# Patient Record
Sex: Male | Born: 1950 | Race: White | Hispanic: No | Marital: Married | Smoking: Former smoker
Health system: Southern US, Community
[De-identification: ages and names within clinical notes are randomized; demographics above are authoritative.]

## PROBLEM LIST (undated history)

## (undated) DIAGNOSIS — Z973 Presence of spectacles and contact lenses: Secondary | ICD-10-CM

## (undated) DIAGNOSIS — F419 Anxiety disorder, unspecified: Secondary | ICD-10-CM

## (undated) DIAGNOSIS — R7303 Prediabetes: Secondary | ICD-10-CM

## (undated) DIAGNOSIS — J302 Other seasonal allergic rhinitis: Secondary | ICD-10-CM

## (undated) DIAGNOSIS — M5430 Sciatica, unspecified side: Secondary | ICD-10-CM

## (undated) DIAGNOSIS — G2581 Restless legs syndrome: Secondary | ICD-10-CM

## (undated) DIAGNOSIS — Z87442 Personal history of urinary calculi: Secondary | ICD-10-CM

## (undated) DIAGNOSIS — K219 Gastro-esophageal reflux disease without esophagitis: Secondary | ICD-10-CM

## (undated) DIAGNOSIS — K5790 Diverticulosis of intestine, part unspecified, without perforation or abscess without bleeding: Secondary | ICD-10-CM

## (undated) DIAGNOSIS — N486 Induration penis plastica: Secondary | ICD-10-CM

## (undated) DIAGNOSIS — F329 Major depressive disorder, single episode, unspecified: Secondary | ICD-10-CM

## (undated) DIAGNOSIS — F32A Depression, unspecified: Secondary | ICD-10-CM

## (undated) HISTORY — DX: Major depressive disorder, single episode, unspecified: F32.9

## (undated) HISTORY — PX: WISDOM TOOTH EXTRACTION: SHX21

## (undated) HISTORY — DX: Depression, unspecified: F32.A

## (undated) HISTORY — DX: Anxiety disorder, unspecified: F41.9

## (undated) HISTORY — PX: HERNIA REPAIR: SHX51

## (undated) HISTORY — PX: COLONOSCOPY: SHX174

---
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
## 2009-05-07 ENCOUNTER — Ambulatory Visit (HOSPITAL_COMMUNITY): Admission: RE | Admit: 2009-05-07 | Discharge: 2009-05-07 | Payer: Self-pay | Admitting: General Surgery

## 2011-04-08 LAB — URINALYSIS, ROUTINE W REFLEX MICROSCOPIC
Bilirubin Urine: NEGATIVE
Glucose, UA: NEGATIVE mg/dL
Ketones, ur: NEGATIVE mg/dL
Protein, ur: NEGATIVE mg/dL
pH: 7 (ref 5.0–8.0)

## 2011-04-08 LAB — HEMOGLOBIN AND HEMATOCRIT, BLOOD: HCT: 39.7 % (ref 39.0–52.0)

## 2011-05-13 NOTE — Op Note (Signed)
NAMEMILIANO, COTTEN               ACCOUNT NO.:  [id number]   MEDICAL RECORD NO.:  [id number]          PATIENT TYPE:  AMB   LOCATION:  DAY                          FACILITY:  [facility]   PHYSICIAN:  Almond Lint, MD       DATE OF BIRTH:  02-Feb-1951   DATE OF PROCEDURE:  05/07/2009  DATE OF DISCHARGE:                               OPERATIVE REPORT   PREOPERATIVE DIAGNOSIS:  Right inguinal hernia.   POSTOPERATIVE DIAGNOSIS:  Right inguinal hernia.   PROCEDURE PERFORMED:  Right inguinal herniorrhaphy with mesh.   SURGEON:  Almond Lint, MD   ASSISTANTLoreta Ave, RNFA.   ANESTHESIA:  General and local.   FINDINGS:  Indirect inguinal hernia and cord lipoma.   SPECIMENS:  None.   ESTIMATED BLOOD LOSS:  Minimal.   COMPLICATIONS:  None known.   DESCRIPTION OF PROCEDURE:  Mr. Bihm was identified in the holding area  and taken to the operating room where he was placed supine on the  operating room table.  General anesthesia was induced.  His groin was  clipped, prepped and draped in a sterile fashion.  The right groin was  draped in sterile fashion.  Time-out was performed according to surgical  safety check list.  When all was correct, we continued.  A curvilinear  oblique incision was made in the right groin around a third of the way  from the pubis to the ASIS.  This was done after administration of local  anesthetic.  Subcutaneous tissue was divided with the Bovie  electrocautery.  Scarpa's fascia was divided with the Bovie.  The  external oblique fascia was cleaned off bluntly with Kitner.  This then  incised with a #15 blade.  The Metzenbaum scissors were used to open up  the external oblique fascia medial to the external inguinal ring and  laterally with ASIS.  The edges of the fascia were elevated with  hemostats and the Kitner was used to clean off the undersurface of the  fascia.  The ilioinguinal nerve was not visualized.  The spermatic cord  and hernia sac were elevated  with the finger and a Penrose drain.  The  hernia sac and cord lipoma were then dissected free from the spermatic  cord structures.  The lipoma was removed and the hernia sac dissected  bluntly from the spermatic cord.  The hernia sac was then opened and  there were no intra-abdominal contents visible in the sac.  This was  closed with a 2-0 silk in a pursestring fashion.  Care was taken to make  sure that none of the spermatic cord was caught up in the suture.  Once  the pursestring suture was applied, the remainder of the sac was  transected.  This was then dunked back into the abdomen.  A 3 x 6 inch  piece of polypropylene mesh was cut to the appropriate size.  Tails were  cut as well.  This was secured to the inferior shelving edge and  Cooper's ligament in a running fashion.  Superiorly, this was secured  with another 2-0 Prolene suture in  a running fashion to the superior  shelving edge.  The tails were tucked underneath the external oblique  and passed around the spermatic cord.  There was one stay suture between  the 2 tails, making sure not to constrict the spermatic cord.  Again,  the tails were tucked below the external oblique.  External oblique was  closed over the top of the mesh using a running 2-0 Vicryl.  Scarpa's  fascia was then closed using a running 3-0 Vicryl.  The skin was  reapproximated with 3-0 deep dermal sutures in interrupted fashion and  then a 4-0 Monocryl in a running subcuticular fashion.  The incision was  then cleaned and dried and dressed with Dermabond.  The patient was  awakened from anesthesia and taken to the PACU in stable condition.      Almond Lint, MD  Electronically Signed     FB/MEDQ  D:  05/07/2009  T:  05/07/2009  Job:  [id number]

## 2016-01-30 DIAGNOSIS — Z0000 Encounter for general adult medical examination without abnormal findings: Secondary | ICD-10-CM | POA: Diagnosis not present

## 2016-01-30 DIAGNOSIS — R7301 Impaired fasting glucose: Secondary | ICD-10-CM | POA: Diagnosis not present

## 2016-01-30 DIAGNOSIS — F329 Major depressive disorder, single episode, unspecified: Secondary | ICD-10-CM | POA: Diagnosis not present

## 2016-01-30 DIAGNOSIS — Z125 Encounter for screening for malignant neoplasm of prostate: Secondary | ICD-10-CM | POA: Diagnosis not present

## 2016-01-30 DIAGNOSIS — Z79899 Other long term (current) drug therapy: Secondary | ICD-10-CM | POA: Diagnosis not present

## 2016-01-30 DIAGNOSIS — H43399 Other vitreous opacities, unspecified eye: Secondary | ICD-10-CM | POA: Diagnosis not present

## 2016-01-30 DIAGNOSIS — Z23 Encounter for immunization: Secondary | ICD-10-CM | POA: Diagnosis not present

## 2016-01-30 DIAGNOSIS — Z136 Encounter for screening for cardiovascular disorders: Secondary | ICD-10-CM | POA: Diagnosis not present

## 2016-01-31 ENCOUNTER — Other Ambulatory Visit: Payer: Self-pay | Admitting: Family Medicine

## 2016-01-31 DIAGNOSIS — Z136 Encounter for screening for cardiovascular disorders: Secondary | ICD-10-CM

## 2016-02-11 ENCOUNTER — Ambulatory Visit
Admission: RE | Admit: 2016-02-11 | Discharge: 2016-02-11 | Disposition: A | Discharge status: Home / Self Care | Payer: Medicare Other | Source: Ambulatory Visit | Attending: Family Medicine | Admitting: Family Medicine

## 2016-02-11 DIAGNOSIS — Z87891 Personal history of nicotine dependence: Secondary | ICD-10-CM | POA: Diagnosis not present

## 2016-02-11 DIAGNOSIS — Z136 Encounter for screening for cardiovascular disorders: Secondary | ICD-10-CM | POA: Diagnosis not present

## 2016-06-25 DIAGNOSIS — F321 Major depressive disorder, single episode, moderate: Secondary | ICD-10-CM | POA: Diagnosis not present

## 2016-06-25 DIAGNOSIS — F411 Generalized anxiety disorder: Secondary | ICD-10-CM | POA: Diagnosis not present

## 2016-07-30 DIAGNOSIS — F321 Major depressive disorder, single episode, moderate: Secondary | ICD-10-CM | POA: Diagnosis not present

## 2016-09-22 DIAGNOSIS — Z23 Encounter for immunization: Secondary | ICD-10-CM | POA: Diagnosis not present

## 2016-11-26 IMAGING — US US ABDOMINAL AORTA SCREENING AAA
1 series · 9 of 9 positions shown · non-contrast
Comparison: None.

CLINICAL DATA: Medicare screening exam for abdominal aortic
aneurysm. Former smoker.

EXAM:
ABDOMINAL AORTA SCREENING ULTRASOUND
TECHNIQUE: Ultrasound examination of the abdominal aorta was performed as a
screening evaluation for abdominal aortic aneurysm.

[Series 1: us abdominal aorta screening aaa · 0.22mm/px · 9 of 9 slices shown]
[im 1/9]
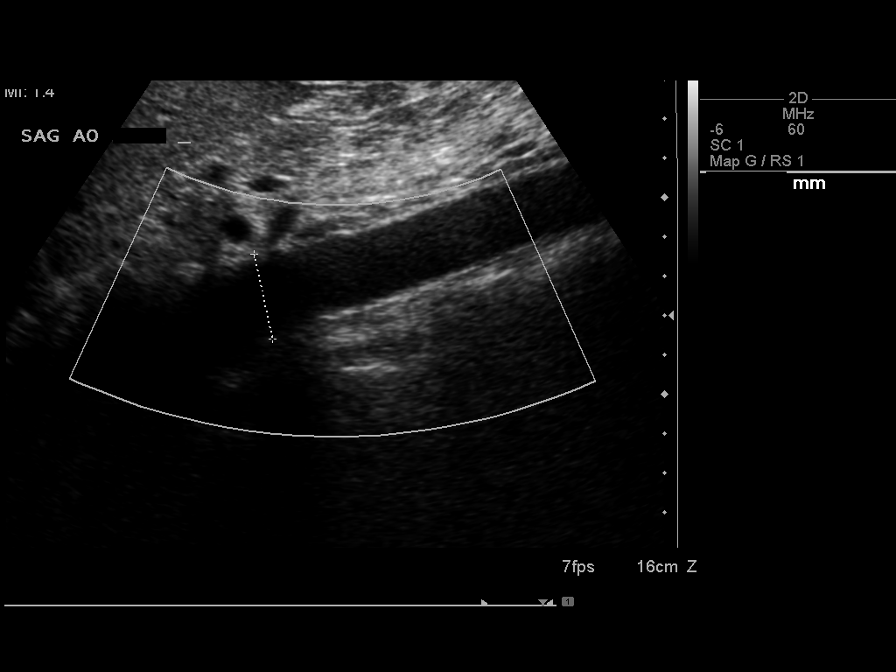
[im 2/9]
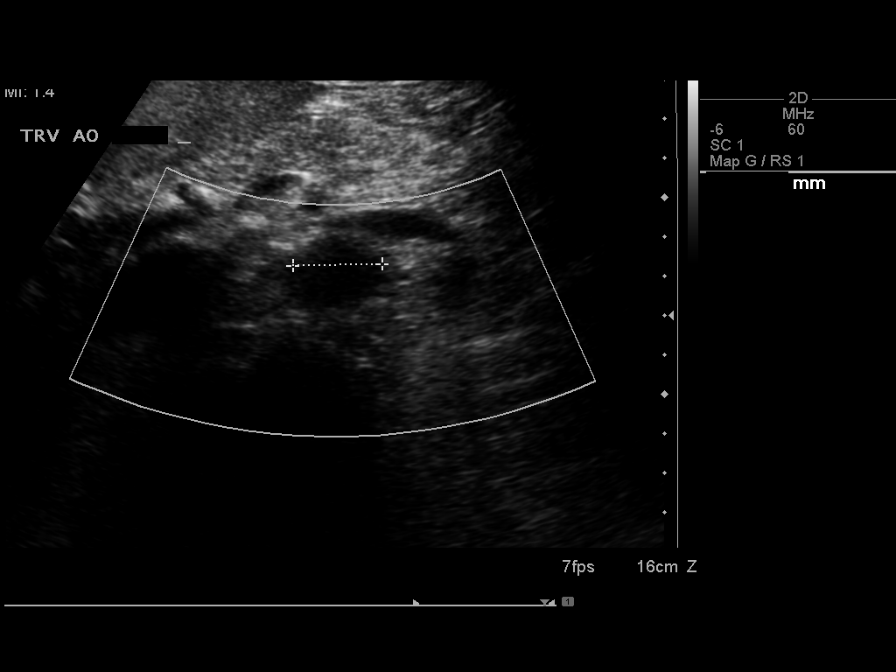
[im 3/9]
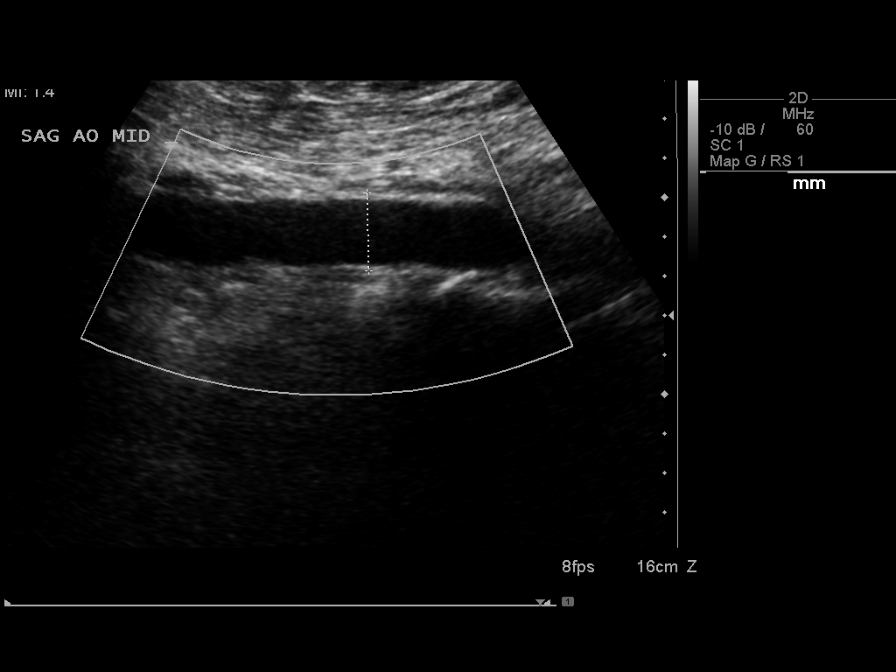
[im 4/9]
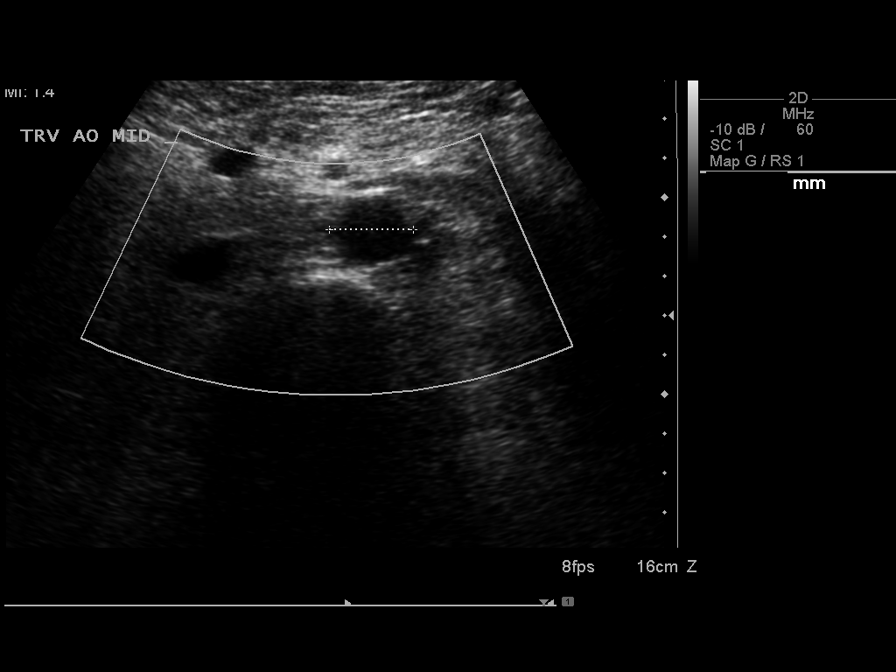
[im 5/9]
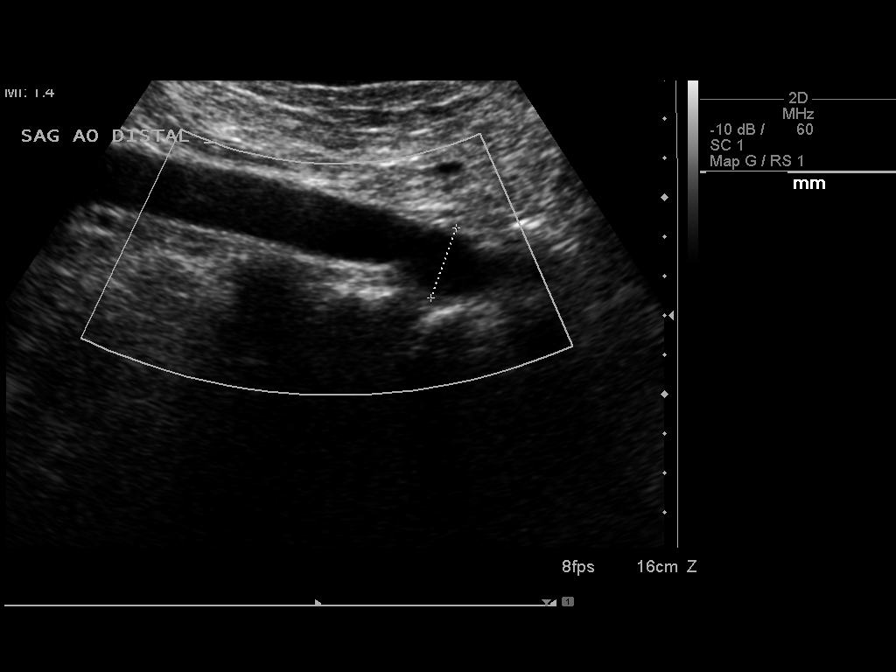
[im 6/9]
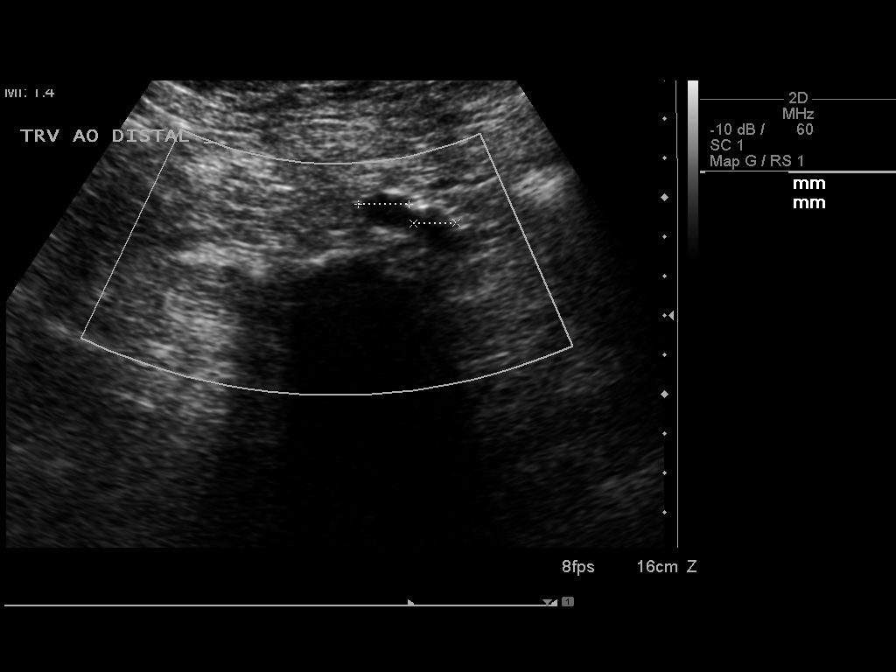
[im 7/9]
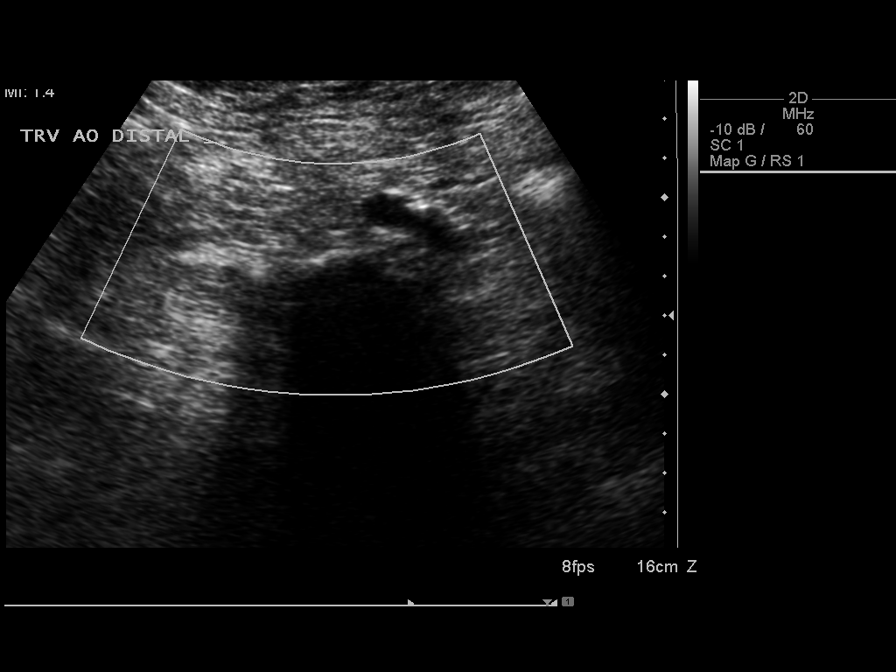
[im 8/9]
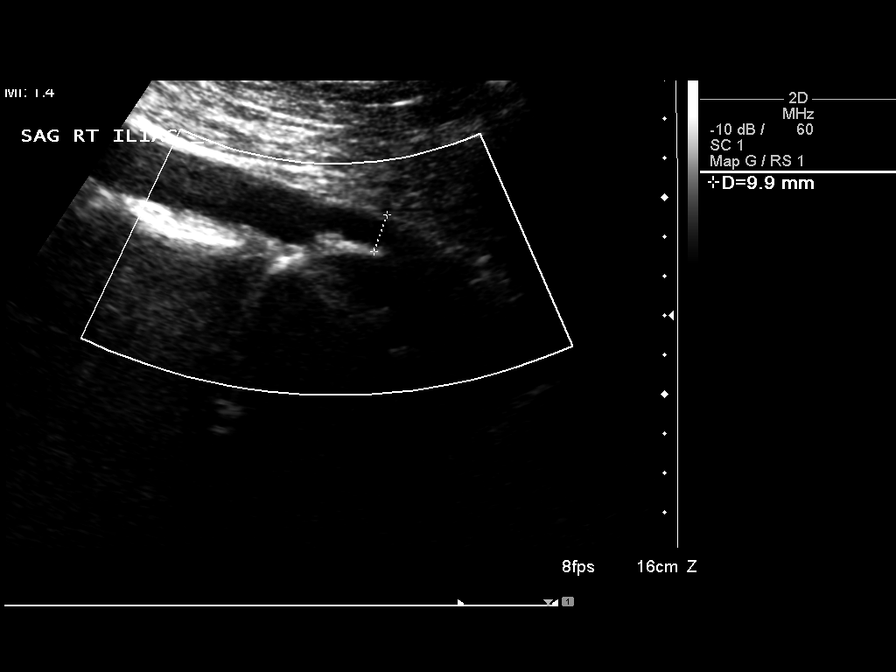
[im 9/9]
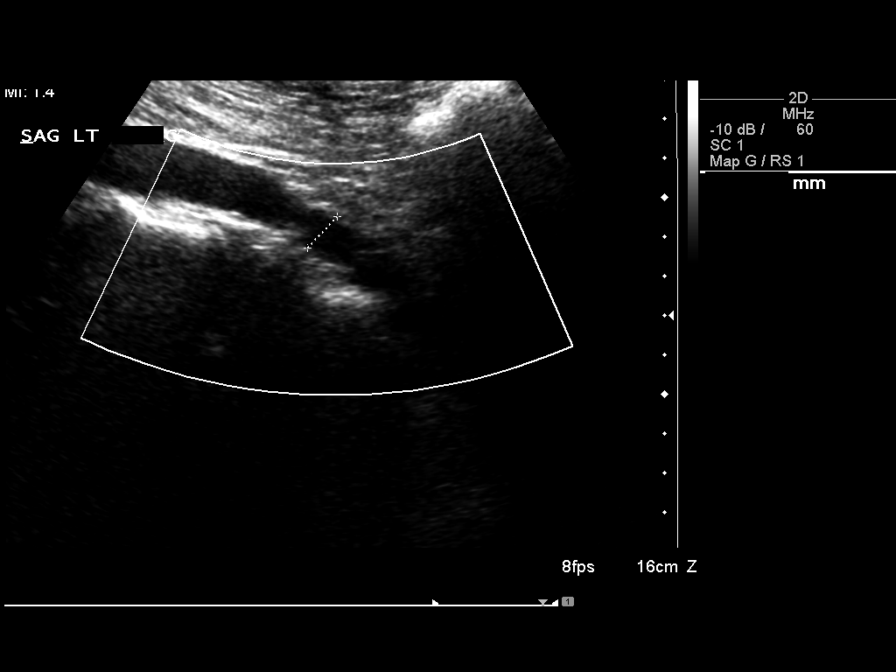

[9 of 9 positions shown; findings below may reference images not displayed]

FINDINGS: Abdominal Aorta

No aneurysm identified. No prominent thrombus or dissection flap
seen.

Maximum abdominal aortic diameter: 23 mm.

Normal bilateral common iliac maximal diameter of 11 mm.
IMPRESSION: Negative for abdominal aortic or common iliac aneurysm.

## 2017-05-28 DIAGNOSIS — Z1212 Encounter for screening for malignant neoplasm of rectum: Secondary | ICD-10-CM | POA: Diagnosis not present

## 2017-05-28 DIAGNOSIS — Z1211 Encounter for screening for malignant neoplasm of colon: Secondary | ICD-10-CM | POA: Diagnosis not present

## 2017-07-31 ENCOUNTER — Ambulatory Visit (INDEPENDENT_AMBULATORY_CARE_PROVIDER_SITE_OTHER): Payer: Medicare Other | Admitting: Urology

## 2017-07-31 ENCOUNTER — Encounter: Payer: Self-pay | Admitting: Urology

## 2017-07-31 VITALS — BP 138/90 | HR 89 | Wt 166.9 lb

## 2017-07-31 DIAGNOSIS — N486 Induration penis plastica: Secondary | ICD-10-CM

## 2017-07-31 NOTE — Progress Notes (Signed)
07/31/2017 11:51 AM   Jon Terry [birth year] [id number]  Referring provider: No referring provider defined for this encounter.  Chief Complaint  Patient presents with  . 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 He was urgently worked up at [facility] [place] for this, however due to his insurance he has to transfer his care to this office for coverage as I flexed which he is interested in pursuing.   He does not have penile curvature with his erections. His symptoms have been worse over the last year.   He has noticed a knot on his penis. He has had a knot for 2 months. He does have penile narrowing with his erections. He has had narrowing for 3 months. He does not have penile shortening with his erections. His symptoms of Peyronie's Disease do have an impact on intercourse. He has not been previously treated.   66 year old male, with 6 month history of nonpainful knots at the base of the penis. The patient has had poor erections for 2 months, believing that the knots that have occurred secondary to sexual activity. The patient has normal erectile activity, but with fusiform soft erection beyond the knots, and softer erection at the base of the penis, making intromission very difficult. He has normal sensation at the glans.   06/23/17: He reports that in fact he has had curvature for about 2-1/2 months. It began after a buckling injury. He said that he has always had ventral curvature of his penis of about 40. After the injury he developed a plaque that has been palpable and now indicates that he has approximately 60 of curvature ventrally. He is not having any pain with erections. He is able erection. He does have some wasting near the base.       PMH: Past Medical History:  Diagnosis Date  . Anxiety and depression     Surgical History: Past Surgical History:  Procedure Laterality Date  . HERNIA REPAIR   1991, 2010    Home Medications:  Allergies as of 07/31/2017   No Known Allergies     Medication List       Accurate as of 07/31/17 11:59 PM. Always use your most recent med list.          citalopram 10 MG tablet Commonly known as:  CELEXA       Allergies: No Known Allergies  Family History: Family History  Problem Relation Age of Onset  . Prostate cancer Neg Hx   . Kidney cancer Neg Hx     Social History:  reports that he has quit smoking. He has never used smokeless tobacco. He reports that he drinks alcohol. He reports that he does not use drugs.  ROS: UROLOGY Frequent Urination?: No Hard to postpone urination?: No Burning/pain with urination?: No Get up at night to urinate?: No Leakage of urine?: No Urine stream starts and stops?: No Trouble starting stream?: No Do you have to strain to urinate?: No Blood in urine?: No Urinary tract infection?: No Sexually transmitted disease?: No Injury to kidneys or bladder?: No Painful intercourse?: Yes Weak stream?: No Erection problems?: Yes Penile pain?: No  Gastrointestinal Nausea?: No Vomiting?: No Indigestion/heartburn?: No Diarrhea?: No Constipation?: No  Constitutional Fever: No Night sweats?: No Weight loss?: No Fatigue?: No  Skin Skin rash/lesions?: No Itching?: No  Eyes Blurred vision?: No Double vision?: No  Ears/Nose/Throat Sore throat?: No Sinus problems?: Yes  Hematologic/Lymphatic Swollen glands?: No Easy bruising?: No  Cardiovascular Leg swelling?: No Chest pain?: No  Respiratory Cough?: No Shortness of breath?: No  Endocrine Excessive thirst?: No  Musculoskeletal Back pain?: No Joint pain?: No  Neurological Headaches?: No Dizziness?: No  Psychologic Depression?: Yes Anxiety?: Yes  Physical Exam: BP 138/90   Pulse 89   Wt 166 lb 14.4 oz (75.7 kg)   Constitutional:  Alert and oriented, No acute distress. HEENT: [place] AT, moist mucus membranes.  Trachea  midline, no masses. Cardiovascular: No clubbing, cyanosis, or edema. Respiratory: Normal respiratory effort, no increased work of breathing. GI: Abdomen is soft, nontender, nondistended, no abdominal masses GU: No CVA tenderness. Palpable plaque at the base of the penis on the ventral and lateral surfaces.  Skin: No rashes, bruises or suspicious lesions. Lymph: No cervical or inguinal adenopathy. Neurologic: Grossly intact, no focal deficits, moving all 4 extremities. Psychiatric: Normal mood and affect.  Laboratory Data: Lab Results  Component Value Date   HGB 13.8 05/07/2009   HCT 39.7 05/07/2009    No results found for: CREATININE  No results found for: PSA  No results found for: TESTOSTERONE  No results found for: HGBA1C  Urinalysis    Component Value Date/Time   COLORURINE YELLOW 05/07/2009 0925   APPEARANCEUR CLEAR 05/07/2009 0925   LABSPEC 1.026 05/07/2009 0925   PHURINE 7.0 05/07/2009 0925   GLUCOSEU NEGATIVE 05/07/2009 0925   HGBUR NEGATIVE 05/07/2009 0925   BILIRUBINUR NEGATIVE 05/07/2009 0925   KETONESUR NEGATIVE 05/07/2009 0925   PROTEINUR NEGATIVE 05/07/2009 0925   UROBILINOGEN 0.2 05/07/2009 0925   NITRITE NEGATIVE 05/07/2009 0925   LEUKOCYTESUR  05/07/2009 0925    NEGATIVE MICROSCOPIC NOT DONE ON URINES WITH NEGATIVE PROTEIN, BLOOD, LEUKOCYTES, NITRITE, OR GLUCOSE <1000 mg/dL.     Assessment & Plan:    1. 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 He has also had this conversation with 2 other urologists so he is very familiar with this medication. He understands the risks, benefits, and side effects. He does understand he will not be able to have intercourse for 4 weeks after each injection due to the risk for penile fracture. He also understands the risks for her bruising. He does know it will take multiple cycles. We went over how the cycles worked again. All questions were answered. The patient  has elected to proceed.  Return for xiaflex.  Hildred LaserBrian James Haileyann Staiger, MD  [facility] [address], [address] [place], [place] [address] [phone]

## 2017-09-02 ENCOUNTER — Ambulatory Visit: Payer: Medicare Other | Admitting: Urology

## 2017-09-03 ENCOUNTER — Ambulatory Visit: Payer: Medicare Other

## 2017-09-04 ENCOUNTER — Ambulatory Visit: Payer: Medicare Other

## 2017-10-09 ENCOUNTER — Ambulatory Visit: Payer: Medicare Other

## 2017-12-14 ENCOUNTER — Encounter: Payer: Self-pay | Admitting: Urology

## 2017-12-14 ENCOUNTER — Ambulatory Visit: Payer: Medicare Other | Admitting: Urology

## 2017-12-14 VITALS — BP 138/79 | HR 62 | Ht 72.0 in | Wt 172.7 lb

## 2017-12-14 DIAGNOSIS — N486 Induration penis plastica: Secondary | ICD-10-CM

## 2017-12-14 MED ORDER — COLLAGENASE CLOSTRID HISTOLYT 0.9 MG IJ SOLR
0.9000 mg | Freq: Once | INTRAMUSCULAR | Status: AC
  Administered 2017-12-14: 0.9 mg via INTRAMUSCULAR

## 2017-12-14 MED ORDER — ALPROSTADIL (VASODILATOR) 20 MCG IC KIT
20.0000 ug | PACK | Freq: Once | INTRACAVERNOUS | Status: AC
  Administered 2017-12-14: 20 ug via BODY_CAVITY

## 2017-12-14 NOTE — Progress Notes (Signed)
12/14/2017 8:55 AM   Jon Terry April 09, 1951 [id number]  Referring provider: Darrow BussingKoirala, Dibas, MD [address] [place], [place] [address]  Chief Complaint  Patient presents with  . Abnormal Penile Curvature    HPI:  66 yo WM with 60 degree ventral curvature and broad plaque at base of penis that is mainly on the right but may spead over to the left.   -started first Xiaflex cycle today - 12/14/2017 - s/p injection #1.  PMH: Past Medical History:  Diagnosis Date  . Anxiety and depression     Surgical History: Past Surgical History:  Procedure Laterality Date  . HERNIA REPAIR  1991, 2010    Home Medications:  Allergies as of 12/14/2017   No Known Allergies     Medication List        Accurate as of 12/14/17  8:55 AM. Always use your most recent med list.          citalopram 10 MG tablet Commonly known as:  CELEXA       Allergies: No Known Allergies  Family History: Family History  Problem Relation Age of Onset  . Prostate cancer Neg Hx   . Kidney cancer Neg Hx     Social History:  reports that he has quit smoking. he has never used smokeless tobacco. He reports that he drinks alcohol. He reports that he does not use drugs.  ROS: UROLOGY Frequent Urination?: No Hard to postpone urination?: No Burning/pain with urination?: No Get up at night to urinate?: No Leakage of urine?: No Urine stream starts and stops?: No Trouble starting stream?: No Do you have to strain to urinate?: No Blood in urine?: No Urinary tract infection?: No Sexually transmitted disease?: No Injury to kidneys or bladder?: No Painful intercourse?: Yes Weak stream?: No Erection problems?: Yes Penile pain?: No  Gastrointestinal Nausea?: No Vomiting?: No Indigestion/heartburn?: No Diarrhea?: No Constipation?: No  Constitutional Fever: No Night sweats?: No Weight loss?: No Fatigue?: No  Skin Skin rash/lesions?: No Itching?: No  Eyes Blurred  vision?: No Double vision?: No  Ears/Nose/Throat Sore throat?: No Sinus problems?: Yes  Hematologic/Lymphatic Swollen glands?: No Easy bruising?: No  Cardiovascular Leg swelling?: No Chest pain?: No  Respiratory Cough?: No Shortness of breath?: No  Endocrine Excessive thirst?: No  Musculoskeletal Back pain?: No Joint pain?: No  Neurological Headaches?: No Dizziness?: No  Psychologic Depression?: No Anxiety?: No  Physical Exam: BP 138/79 (BP Location: Right Arm, Patient Position: Sitting, Cuff Size: Normal)   Pulse 62   Ht 6' (1.829 m)   Wt 78.3 kg (172 lb 11.2 oz)   BMI 23.42 kg/m   Constitutional:  Alert and oriented, No acute distress. GU: disc like plaque at base of penis, lateral, ventral, palpable on the right and may involve left.    Procedure - the treatment area was identified as follows: I discussed the nature r/b/a to prostaglandin injection. A single intracavernosal injection of 10 micrograms of alprostadil was injected in the left corpora.   Located the plaque at the point of maximum concavity (or focal point) in the bend of the penis - right base, ventral curve Markd the point with a surgical marker. Degree curvature measured: 60 degress   He returned this afternoon -- Injection Procedure for Peyronie's Disease  Applied antiseptic at the site of the injection and allowed the skin to dry. The penis was flaccid. I placed the needle tip on the side of the target plaque in alignment with  the point of maximal concavity. Oriented the needle so that it entered the edge of the plaque and advanced the needle into the plaque itself from the side. (the needle was not advanced beneath the plaque nor perpendicularly towards the corpora cavernosum). I inserted and advance the needle transversely through the width of the plaque, towards the opposite side of the plaque without passing completely through it. Proper needle position is tested and confirmed by carefully  noting resistance to minimal depression of the syringe plunger. With the tip of the needle placed within the plaque, initiated injection, maintaining steady pressure to slowly inject XIAFLEX into the plaque. Withdrew the needle slowly so as to deposit the full dose along the needle track within the plaque. The plaque is at least 10 mm in width. I deposited the full dose entirely within and cross the plaque. A total of 0.25 mL of reconstituted solution (containing 0.58 mg of XIAFLEX) was injected. Pt tolerated well but it was painful.  Upon complete withdrawal of the needle, gentle pressure at the injection site was applied.     Laboratory Data: Lab Results  Component Value Date   HGB 13.8 05/07/2009   HCT 39.7 05/07/2009    No results found for: CREATININE  No results found for: PSA1  No results found for: TESTOSTERONE  No results found for: HGBA1C  Urinalysis Lab Results  Component Value Date   APPEARANCEUR CLEAR 05/07/2009   LEUKOCYTESUR  05/07/2009    NEGATIVE MICROSCOPIC NOT DONE ON URINES WITH NEGATIVE PROTEIN, BLOOD, LEUKOCYTES, NITRITE, OR GLUCOSE <1000 mg/dL.   PROTEINUR NEGATIVE 05/07/2009   GLUCOSEU NEGATIVE 05/07/2009   BILIRUBINUR NEGATIVE 05/07/2009   NITRITE NEGATIVE 05/07/2009    No results found for: LABMICR, WBCUA, RBCUA, LABEPIT, MUCUS, BACTERIA    Assessment & Plan:  Peyronie's disease - reminded of no sexual activity x 1 mo due to risk of penile fracture/rupture/tear. He'll return in 2-3 days for injection #2.   There are no diagnoses linked to this encounter.  No Follow-up on file.  Jerilee FieldMatthew Terah Robey, MD  [facility] [address], [address] [place], [place] [address] [phone]

## 2017-12-17 ENCOUNTER — Ambulatory Visit: Payer: Medicare Other

## 2017-12-17 ENCOUNTER — Ambulatory Visit (INDEPENDENT_AMBULATORY_CARE_PROVIDER_SITE_OTHER): Payer: Medicare Other | Admitting: Urology

## 2017-12-17 ENCOUNTER — Encounter: Payer: Self-pay | Admitting: Urology

## 2017-12-17 VITALS — BP 121/78 | HR 71 | Ht 72.0 in | Wt 166.2 lb

## 2017-12-17 DIAGNOSIS — N486 Induration penis plastica: Secondary | ICD-10-CM

## 2017-12-17 MED ORDER — COLLAGENASE CLOSTRID HISTOLYT 0.9 MG IJ SOLR
0.9000 mg | Freq: Once | INTRAMUSCULAR | Status: AC
  Administered 2017-12-17: 0.9 mg via INTRAMUSCULAR

## 2017-12-17 NOTE — Addendum Note (Signed)
Addended by: Honor LohGARRISON, Karmela Bram M on: 12/17/2017 11:07 AM   Modules accepted: Orders

## 2017-12-17 NOTE — Progress Notes (Signed)
Procedure: 1. Apply antiseptic at the site of the injection and allow the skin to dry. 2. Using a new hubless syringe containing 0.01 mL graduations with a permanently fixed 27-gauge -inch needle (not supplied), withdraw a volume of 0.25 mL of reconstituted solution (containing 0.58 mg of XIAFLEX). 3. The needle was placed on the side of the target plaque in alignment with the point of maximal concavity. I entered the edge of the plaque and advanced the needle into the plaque itself from the side.  4. I Inserted and advanced the needle transversely through the width of the plaque, towards the opposite side of the plaque without passing completely through it. There was resistance to minimal depression of the syringe plunger. 5. With the tip of the needle placed within the plaque, I initiated injection, maintaining steady pressure to slowly inject XIAFLEX into the plaque and slowly withdrew the needle to deposit the full dose along the needle track within the plaque.  6. Pressure was held.

## 2017-12-18 ENCOUNTER — Ambulatory Visit (INDEPENDENT_AMBULATORY_CARE_PROVIDER_SITE_OTHER): Payer: Medicare Other | Admitting: Urology

## 2017-12-18 ENCOUNTER — Encounter: Payer: Self-pay | Admitting: Urology

## 2017-12-18 VITALS — BP 112/75 | HR 73 | Ht 72.0 in | Wt 166.0 lb

## 2017-12-18 DIAGNOSIS — N486 Induration penis plastica: Secondary | ICD-10-CM

## 2017-12-18 NOTE — Progress Notes (Signed)
Procedure:  Xiaflex modeling Patient's Peyronie's plaque is identified. It is palpated on the left mid shaft.  Wearing gloves, I grasped the plaque about 1 cm proximal and distal to the injection site avoiding direct pressure on the injection site.  Using the target plaque as a fulcrum point, I  used both hands to apply firm, steady pressure to elongate and stretch the plaque.  The penis was bent opposite to the patient's penile curvature, with stretching to the point of moderate resistance. I held pressure for 30 seconds then released. After a 30 second rest period, I repeated the penile modeling technique for a total of 3 modeling attempts at 30 seconds for each attempt.  He will follow-up in 6 weeks for cycle #2.  We went over home modeling again.  He was again warned to not participate intercourse or other sexual activity for 4 weeks.

## 2018-01-28 ENCOUNTER — Ambulatory Visit: Payer: Medicare Other

## 2018-01-29 ENCOUNTER — Ambulatory Visit (INDEPENDENT_AMBULATORY_CARE_PROVIDER_SITE_OTHER): Payer: Medicare Other | Admitting: Urology

## 2018-01-29 ENCOUNTER — Ambulatory Visit: Payer: Medicare Other

## 2018-01-29 ENCOUNTER — Encounter: Payer: Self-pay | Admitting: Urology

## 2018-01-29 VITALS — BP 130/80 | HR 76 | Ht 72.0 in | Wt 169.5 lb

## 2018-01-29 DIAGNOSIS — N486 Induration penis plastica: Secondary | ICD-10-CM | POA: Diagnosis not present

## 2018-01-29 NOTE — Progress Notes (Signed)
01/29/2018 10:26 AM   Jon Terry 1951/05/27 [id number]  Referring provider: Darrow Bussing, MD [address] [place], [place] [address]  Chief Complaint  Patient presents with  . Abnormal Penile Curvature    HPI: The patient is a the 60 degree ventral curvature of a broad 67 year old gentleman with plaque at the base the penis that is mainly on the right who underwent Xiaflex cycle 1 in mid December 2018.  He returns today to discuss questions prior to undergoing Xiaflex cycle 2.  He did notice some improvement of curvature.  He did state that intercourse was slightly easier.  He however is not satisfied with the results at this time and would like further improvement.   PMH: Past Medical History:  Diagnosis Date  . Anxiety and depression     Surgical History: Past Surgical History:  Procedure Laterality Date  . HERNIA REPAIR  1991, 2010    Home Medications:  Allergies as of 01/29/2018   No Known Allergies     Medication List        Accurate as of 01/29/18 10:26 AM. Always use your most recent med list.          citalopram 10 MG tablet Commonly known as:  CELEXA       Allergies: No Known Allergies  Family History: Family History  Problem Relation Age of Onset  . Prostate cancer Neg Hx   . Kidney cancer Neg Hx     Social History:  reports that he has quit smoking. he has never used smokeless tobacco. He reports that he drinks alcohol. He reports that he does not use drugs.  ROS: UROLOGY Frequent Urination?: No Hard to postpone urination?: No Burning/pain with urination?: No Get up at night to urinate?: No Leakage of urine?: No Urine stream starts and stops?: No Trouble starting stream?: No Do you have to strain to urinate?: No Blood in urine?: No Urinary tract infection?: No Sexually transmitted disease?: No Injury to kidneys or bladder?: No Painful intercourse?: No Weak stream?: No Erection problems?: No Penile pain?:  Yes  Gastrointestinal Nausea?: No Vomiting?: No Indigestion/heartburn?: No Diarrhea?: No Constipation?: No  Constitutional Fever: No Night sweats?: No Weight loss?: No Fatigue?: No  Skin Skin rash/lesions?: No Itching?: No  Eyes Blurred vision?: No Double vision?: No  Ears/Nose/Throat Sore throat?: No Sinus problems?: Yes  Hematologic/Lymphatic Swollen glands?: No Easy bruising?: No  Cardiovascular Leg swelling?: No Chest pain?: No  Respiratory Cough?: No Shortness of breath?: No  Endocrine Excessive thirst?: No  Musculoskeletal Back pain?: Yes Joint pain?: No  Neurological Headaches?: No Dizziness?: No  Psychologic Depression?: Yes Anxiety?: Yes  Physical Exam: BP 130/80 (BP Location: Right Arm, Patient Position: Sitting, Cuff Size: Normal)   Pulse 76   Ht 6' (1.829 m)   Wt 169 lb 8 oz (76.9 kg)   BMI 22.99 kg/m   Constitutional:  Alert and oriented, No acute distress. HEENT: [place] AT, moist mucus membranes.  Trachea midline, no masses. Cardiovascular: No clubbing, cyanosis, or edema. Respiratory: Normal respiratory effort, no increased work of breathing. GI: Abdomen is soft, nontender, nondistended, no abdominal masses GU: No CVA tenderness.  No previous plaque on base of penis the ventral side noted. Skin: No rashes, bruises or suspicious lesions. Lymph: No cervical or inguinal adenopathy. Neurologic: Grossly intact, no focal deficits, moving all 4 extremities. Psychiatric: Normal mood and affect.  Laboratory Data: Lab Results  Component Value Date   HGB 13.8 05/07/2009  HCT 39.7 05/07/2009    No results found for: CREATININE  No results found for: PSA  No results found for: TESTOSTERONE  No results found for: HGBA1C  Urinalysis    Component Value Date/Time   COLORURINE YELLOW 05/07/2009 0925   APPEARANCEUR CLEAR 05/07/2009 0925   LABSPEC 1.026 05/07/2009 0925   PHURINE 7.0 05/07/2009 0925   GLUCOSEU NEGATIVE 05/07/2009  0925   HGBUR NEGATIVE 05/07/2009 0925   BILIRUBINUR NEGATIVE 05/07/2009 0925   KETONESUR NEGATIVE 05/07/2009 0925   PROTEINUR NEGATIVE 05/07/2009 0925   UROBILINOGEN 0.2 05/07/2009 0925   NITRITE NEGATIVE 05/07/2009 0925   LEUKOCYTESUR  05/07/2009 0925    NEGATIVE MICROSCOPIC NOT DONE ON URINES WITH NEGATIVE PROTEIN, BLOOD, LEUKOCYTES, NITRITE, OR GLUCOSE <1000 mg/dL.     Assessment & Plan:    1.  Perrone's disease Patient optimistic with results after Xiaflex cycle 1.  He is now due for Xiaflex cycle 2.  Return for Xiaflex cycle 2.  Hildred LaserBrian James Tamario Heal, MD  [facility] [address], [address] [place], [place] [address] [phone]

## 2018-02-18 ENCOUNTER — Ambulatory Visit: Payer: Medicare Other | Admitting: Urology

## 2018-02-18 ENCOUNTER — Encounter: Payer: Self-pay | Admitting: Urology

## 2018-02-18 VITALS — BP 126/78 | HR 60 | Ht 72.0 in | Wt 171.2 lb

## 2018-02-18 DIAGNOSIS — N486 Induration penis plastica: Secondary | ICD-10-CM

## 2018-02-18 MED ORDER — ALPROSTADIL (VASODILATOR) 20 MCG IC KIT
20.0000 ug | PACK | Freq: Once | INTRACAVERNOUS | Status: AC
  Administered 2018-02-18: 20 ug via BODY_CAVITY

## 2018-02-18 MED ORDER — COLLAGENASE CLOSTRID HISTOLYT 0.9 MG IJ SOLR
0.9000 mg | Freq: Once | INTRAMUSCULAR | Status: AC
  Administered 2018-02-18: 0.9 mg via INTRAMUSCULAR

## 2018-02-18 NOTE — Progress Notes (Signed)
Procedure: I Induced a penile erection. A single intracavernosal injection of 10 micrograms of alprostadil was injected into the right corpora after it was prepped. I located the plaque at the point of maximum concavity (or focal point) in the bend of the penis (lateral right base) and marked with a surgical marker.  He returned this afternoon and I carried out the Injection Procedure for Peyronie's Disease   1. Apply antiseptic at the site of the injection and allow the skin to dry. 2. Using a new hubless syringe containing 0.01 mL graduations with a permanently fixed 27-gauge -inch needle (not supplied), withdraw a volume of 0.25 mL of reconstituted solution (containing 0.58 mg of XIAFLEX). 3. The needle was placed on the side of the target plaque in alignment with the point of maximal concavity. I entered the edge of the plaque and advanced the needle into the plaque itself from the side.  4. I Inserted and advanced the needle transversely through the width of the plaque, towards the opposite side of the plaque without passing completely through it. There was resistance to minimal depression of the syringe plunger. 5. With the tip of the needle placed within the plaque, I initiated injection, maintaining steady pressure to slowly inject XIAFLEX into the plaque and slowly withdrew the needle to deposit the full dose along the needle track within the plaque.  6. Pressure was held.

## 2018-02-19 ENCOUNTER — Ambulatory Visit (INDEPENDENT_AMBULATORY_CARE_PROVIDER_SITE_OTHER): Payer: Medicare Other | Admitting: Urology

## 2018-02-19 VITALS — BP 127/80 | HR 62 | Ht 72.0 in | Wt 172.0 lb

## 2018-02-19 DIAGNOSIS — N486 Induration penis plastica: Secondary | ICD-10-CM

## 2018-02-19 NOTE — Progress Notes (Signed)
   02/19/18  CC: Penile curvature  HPI: 67 yo WM with 60 degree ventral curvature and broad plaque at base of penis that is mainly on the right but may spead over to the left.   -completed cycle #1 12/18/2018  He started cycle #2 yesterday with first injection.   Procedure - the right corpora was prepped and I deposited the full dose entirely within the right corpora (A total of 0.25 mL of reconstituted solution containing 0.58 mg of XIAFLEX) and missed the plaque.   Post-Procedure: - Patient tolerated the procedure well  Assessment/ Plan:  He'll return in 1-3 for repeat for repeat # 2 injection as I missed the plaque.

## 2018-02-22 ENCOUNTER — Encounter: Payer: Self-pay | Admitting: Urology

## 2018-02-22 ENCOUNTER — Ambulatory Visit: Payer: Medicare Other | Admitting: Urology

## 2018-02-22 VITALS — BP 127/71 | HR 63 | Ht 72.0 in | Wt 172.0 lb

## 2018-02-22 DIAGNOSIS — N486 Induration penis plastica: Secondary | ICD-10-CM | POA: Diagnosis not present

## 2018-02-22 MED ORDER — COLLAGENASE CLOSTRID HISTOLYT 0.9 MG IJ SOLR
0.9000 mg | Freq: Once | INTRAMUSCULAR | Status: AC
  Administered 2018-02-22: 0.9 mg via INTRAMUSCULAR

## 2018-02-22 NOTE — Progress Notes (Signed)
Procedure: Xiaflex injection   He presents for his second Xiaflex injection/first cycle.  Previously marked penile plaque site Remained visible with marking pen. The plaque was easily palpable in the right corpus cavernosum at the base.   Xiaflex injection (0.58 mg) was then injected directly into the plaque at the point of maximal curvature at an angle after to being warm to room temperature using a small TB needle. He tolerated the procedure very well.   Plan: He will follow-up for penile modeling

## 2018-07-28 ENCOUNTER — Ambulatory Visit: Payer: Medicare Other | Admitting: Urology

## 2018-07-28 ENCOUNTER — Encounter: Payer: Self-pay | Admitting: Urology

## 2018-07-28 VITALS — BP 123/73 | HR 68 | Ht 72.0 in | Wt 171.1 lb

## 2018-07-28 DIAGNOSIS — N486 Induration penis plastica: Secondary | ICD-10-CM

## 2018-07-28 MED ORDER — ALPROSTADIL (VASODILATOR) 20 MCG IC KIT
20.0000 ug | PACK | Freq: Once | INTRACAVERNOUS | Status: AC
  Administered 2018-07-28: 20 ug via BODY_CAVITY

## 2018-07-28 MED ORDER — COLLAGENASE CLOSTRID HISTOLYT 0.9 MG IJ SOLR
0.9000 mg | Freq: Once | INTRAMUSCULAR | Status: AC
  Administered 2018-07-28: 0.9 mg via INTRAMUSCULAR

## 2018-07-28 NOTE — Progress Notes (Signed)
67 year old male presents today for Xiaflex first injection/cycle 3.  He was previously having downward curvature and after the second cycle the downward curvature has resolved and he now has curvature to the right.  Procedure: I Induced a penile erection. A single intracavernosal injection of 10 micrograms of alprostadil was injected into the left corpus after it was prepped. I located the plaque at the point of maximum concavity (or focal point) in the bend of the penis and marked with a surgical marker.  There was curvature to the right at approximately 45 degrees.  He returned this afternoon and I carried out the Injection Procedure for Peyronie's Disease   1. Apply antiseptic at the site of the injection and allow the skin to dry. 2. Using a new hubless syringe containing 0.01 mL graduations with a permanently fixed 27-gauge -inch needle (not supplied), withdraw a volume of 0.25 mL of reconstituted solution (containing 0.58 mg of XIAFLEX). 3. The needle was placed on the side of the target plaque in alignment with the point of maximal concavity. I entered the edge of the plaque and advanced the needle into the plaque itself from the side.  4. I Inserted and advanced the needle transversely through the width of the plaque, towards the opposite side of the plaque without passing completely through it. There was resistance to minimal depression of the syringe plunger. 5. With the tip of the needle placed within the plaque, I initiated injection, maintaining steady pressure to slowly inject XIAFLEX into the plaque and slowly withdrew the needle to deposit the full dose along the needle track within the plaque.  6. Pressure was held.

## 2018-07-30 ENCOUNTER — Encounter: Payer: Self-pay | Admitting: Urology

## 2018-07-30 ENCOUNTER — Ambulatory Visit (INDEPENDENT_AMBULATORY_CARE_PROVIDER_SITE_OTHER): Payer: Medicare Other | Admitting: Urology

## 2018-07-30 VITALS — BP 118/76 | HR 71 | Ht 72.0 in | Wt 172.0 lb

## 2018-07-30 DIAGNOSIS — N486 Induration penis plastica: Secondary | ICD-10-CM

## 2018-07-30 MED ORDER — COLLAGENASE CLOSTRID HISTOLYT 0.9 MG IJ SOLR
0.9000 mg | Freq: Once | INTRAMUSCULAR | Status: AC
  Administered 2018-07-30: 0.9 mg via INTRAMUSCULAR

## 2018-07-30 NOTE — Progress Notes (Signed)
Procedure: Xiaflex injection- second injection/third cycle  Previously marked penile plaque site Remained visible with marking pen. The plaque was easily palpable on the right proximal shaft . Xiaflex injection (0.58 mg) was then injected directly into the plaque at the point of maximal curvature at an angle after to being warm to room temperature using a small TB needle. He tolerated the procedure very well.   - Patient tolerated the procedure well - Follow-up 6 weeks  Irineo AxonScott Keir Foland, MD

## 2018-09-20 ENCOUNTER — Encounter: Payer: Self-pay | Admitting: Urology

## 2018-09-20 ENCOUNTER — Ambulatory Visit: Payer: Medicare Other | Admitting: Urology

## 2018-09-22 ENCOUNTER — Ambulatory Visit: Payer: Medicare Other | Admitting: Urology

## 2019-09-16 ENCOUNTER — Emergency Department (HOSPITAL_COMMUNITY)
Admission: EM | Admit: 2019-09-16 | Discharge: 2019-09-16 | Disposition: A | Discharge status: Home / Self Care | Payer: Medicare Other | Attending: Emergency Medicine | Admitting: Emergency Medicine

## 2019-09-16 ENCOUNTER — Encounter (HOSPITAL_COMMUNITY): Payer: Self-pay | Admitting: Emergency Medicine

## 2019-09-16 ENCOUNTER — Emergency Department (HOSPITAL_COMMUNITY): Payer: Medicare Other

## 2019-09-16 ENCOUNTER — Other Ambulatory Visit: Payer: Self-pay

## 2019-09-16 DIAGNOSIS — N201 Calculus of ureter: Secondary | ICD-10-CM | POA: Diagnosis not present

## 2019-09-16 DIAGNOSIS — N1330 Unspecified hydronephrosis: Secondary | ICD-10-CM | POA: Insufficient documentation

## 2019-09-16 DIAGNOSIS — K5790 Diverticulosis of intestine, part unspecified, without perforation or abscess without bleeding: Secondary | ICD-10-CM

## 2019-09-16 DIAGNOSIS — Z87891 Personal history of nicotine dependence: Secondary | ICD-10-CM | POA: Insufficient documentation

## 2019-09-16 DIAGNOSIS — N200 Calculus of kidney: Secondary | ICD-10-CM

## 2019-09-16 DIAGNOSIS — R1030 Lower abdominal pain, unspecified: Secondary | ICD-10-CM | POA: Diagnosis present

## 2019-09-16 DIAGNOSIS — Z79899 Other long term (current) drug therapy: Secondary | ICD-10-CM | POA: Insufficient documentation

## 2019-09-16 DIAGNOSIS — Z87442 Personal history of urinary calculi: Secondary | ICD-10-CM

## 2019-09-16 HISTORY — DX: Diverticulosis of intestine, part unspecified, without perforation or abscess without bleeding: K57.90

## 2019-09-16 HISTORY — DX: Personal history of urinary calculi: Z87.442

## 2019-09-16 LAB — CBC WITH DIFFERENTIAL/PLATELET
Abs Immature Granulocytes: 0.02 10*3/uL (ref 0.00–0.07)
Basophils Absolute: 0 10*3/uL (ref 0.0–0.1)
Basophils Relative: 0 %
Eosinophils Absolute: 0 10*3/uL (ref 0.0–0.5)
Eosinophils Relative: 0 %
HCT: 47.5 % (ref 39.0–52.0)
Hemoglobin: 16 g/dL (ref 13.0–17.0)
Immature Granulocytes: 0 %
Lymphocytes Relative: 9 %
Lymphs Abs: 0.7 10*3/uL (ref 0.7–4.0)
MCH: 30.6 pg (ref 26.0–34.0)
MCHC: 33.7 g/dL (ref 30.0–36.0)
MCV: 90.8 fL (ref 80.0–100.0)
Monocytes Absolute: 0.4 10*3/uL (ref 0.1–1.0)
Monocytes Relative: 5 %
Neutro Abs: 6.6 10*3/uL (ref 1.7–7.7)
Neutrophils Relative %: 86 %
Platelets: 216 10*3/uL (ref 150–400)
RBC: 5.23 MIL/uL (ref 4.22–5.81)
RDW: 13.4 % (ref 11.5–15.5)
WBC: 7.6 10*3/uL (ref 4.0–10.5)
nRBC: 0 % (ref 0.0–0.2)

## 2019-09-16 LAB — URINALYSIS, ROUTINE W REFLEX MICROSCOPIC
Bacteria, UA: NONE SEEN
Bilirubin Urine: NEGATIVE
Glucose, UA: NEGATIVE mg/dL
Ketones, ur: 20 mg/dL — AB
Leukocytes,Ua: NEGATIVE
Nitrite: NEGATIVE
Protein, ur: 100 mg/dL — AB
RBC / HPF: >50 RBC/hpf — ABNORMAL HIGH (ref 0–5)
Specific Gravity, Urine: 1.031 — ABNORMAL HIGH (ref 1.005–1.030)
pH: 6 (ref 5.0–8.0)

## 2019-09-16 LAB — COMPREHENSIVE METABOLIC PANEL
ALT: 18 U/L (ref 0–44)
AST: 26 U/L (ref 15–41)
Albumin: 4.6 g/dL (ref 3.5–5.0)
Alkaline Phosphatase: 55 U/L (ref 38–126)
Anion gap: 10 (ref 5–15)
BUN: 18 mg/dL (ref 8–23)
CO2: 24 mmol/L (ref 22–32)
Calcium: 10.2 mg/dL (ref 8.9–10.3)
Chloride: 105 mmol/L (ref 98–111)
Creatinine, Ser: 0.93 mg/dL (ref 0.61–1.24)
GFR calc Af Amer: >60 mL/min (ref >60)
GFR calc non Af Amer: >60 mL/min (ref >60)
Glucose, Bld: 145 mg/dL — ABNORMAL HIGH (ref 70–99)
Potassium: 3.5 mmol/L (ref 3.5–5.1)
Sodium: 139 mmol/L (ref 135–145)
Total Bilirubin: 0.8 mg/dL (ref 0.3–1.2)
Total Protein: 7.7 g/dL (ref 6.5–8.1)

## 2019-09-16 LAB — LIPASE, BLOOD: Lipase: 38 U/L (ref 11–51)

## 2019-09-16 MED ORDER — SODIUM CHLORIDE 0.9 % IV BOLUS
500.0000 mL | Freq: Once | INTRAVENOUS | Status: AC
  Administered 2019-09-16: 13:00:00 500 mL via INTRAVENOUS

## 2019-09-16 MED ORDER — SODIUM CHLORIDE (PF) 0.9 % IJ SOLN
INTRAMUSCULAR | Status: AC
  Administered 2019-09-16: 14:00:00
  Filled 2019-09-16: qty 50

## 2019-09-16 MED ORDER — MORPHINE SULFATE (PF) 4 MG/ML IV SOLN
4.0000 mg | Freq: Once | INTRAVENOUS | Status: AC
  Administered 2019-09-16: 13:00:00 4 mg via INTRAVENOUS
  Filled 2019-09-16: qty 1

## 2019-09-16 MED ORDER — IOHEXOL 300 MG/ML  SOLN
100.0000 mL | Freq: Once | INTRAMUSCULAR | Status: AC | PRN
  Administered 2019-09-16: 100 mL via INTRAVENOUS

## 2019-09-16 MED ORDER — HYDROCODONE-ACETAMINOPHEN 5-325 MG PO TABS: 1.0000 | ORAL_TABLET | Freq: Four times a day (QID) | ORAL | 0 refills | Status: DC | PRN

## 2019-09-16 MED ORDER — ONDANSETRON 4 MG PO TBDP: 4.0000 mg | ORAL_TABLET | Freq: Three times a day (TID) | ORAL | 0 refills | Status: DC | PRN

## 2019-09-16 MED ORDER — TAMSULOSIN HCL 0.4 MG PO CAPS: 0.4000 mg | ORAL_CAPSULE | Freq: Every day | ORAL | 0 refills | Status: DC

## 2019-09-16 MED ORDER — ONDANSETRON HCL 4 MG/2ML IJ SOLN
4.0000 mg | Freq: Once | INTRAMUSCULAR | Status: AC | PRN
  Administered 2019-09-16: 4 mg via INTRAVENOUS
  Filled 2019-09-16: qty 2

## 2019-09-16 NOTE — ED Triage Notes (Signed)
Per pt, states he has a history of diverticulitis-states intense pain on left side which started last night-states now having generalized abdominal pain 4/10-N/V-last attack was in June of this year

## 2019-09-16 NOTE — Discharge Instructions (Addendum)
You have been diagnosed today with left-sided kidney stone.  At this time there does not appear to be the presence of an emergent medical condition, however there is always the potential for conditions to change. Please read and follow the below instructions.  Please return to the Emergency Department immediately for any new or worsening symptoms or if your symptoms do not improve within 2 days. Please be sure to follow up with your Primary Care Provider within one week regarding your visit today; please call their office to schedule an appointment even if you are feeling better for a follow-up visit. Please call your urologist today to schedule follow-up appointment regarding your kidney stone.  Your kidney stone is 6 mm x 3 mm in size. Your CT scan also showed possible cysts of your liver, a cyst of the left kidney, left hydronephrosis, atherosclerosis of the aorta diverticulosis of the colon.  Discuss these findings with your primary care provider at your next visit. You may use the pain medication Norco as prescribed for severe pain.  Do not drive or operate machinery while taking Norco as will make you drowsy.  Do not drink alcohol or take other sedating medications with Norco as this will worsen side effects. You may use the medication Zofran as prescribed for nausea and vomiting. You may use the medication Flomax as prescribed to help facilitate stone passage.  Get help right away if: You have a fever or chills. You get very bad pain. You get new pain in your belly (abdomen). You pass out (faint). You cannot pee. Any new/concerning or worsening symptoms.   Please read the additional information packets attached to your discharge summary.  Do not take your medicine if  develop an itchy rash, swelling in your mouth or lips, or difficulty breathing; call 911 and seek immediate emergency medical attention if this occurs.

## 2019-09-16 NOTE — ED Provider Notes (Signed)
[facility]-EMERGENCY DEPT Provider Note   CSN: [id number] Arrival date & time: 09/16/19  0957     History   Chief Complaint Chief Complaint  Patient presents with  . Abdominal Pain    HPI Jon Terry is a 68 y.o. male with history of anxiety/depression, diverticulitis and hernia repair otherwise healthy presents today for left lower abdominal pain that began around 8 PM last night.  Patient reports gradually worsening pain of the left lower abdomen consistent with previous bouts of diverticulitis, describes a severe throbbing sensation constant worsened with palpation and without clear alleviating factors.  He reports pain has somewhat improved since arriving to the emergency department.  Pain will radiate across his lower abdomen to his right lower abdomen.  He reports intermittent nausea and nonbloody/nonbilious emesis.  Reports small stools today without blood or diarrhea.  Denies fevers/chills, headache, chest pain/shortness of breath, cough, dysuria/hematuria,, testicular pain/swelling, fall/injury or any additional concerns.    HPI  Past Medical History:  Diagnosis Date  . Anxiety and depression     There are no active problems to display for this patient.   Past Surgical History:  Procedure Laterality Date  . HERNIA REPAIR  1991, 2010        Home Medications    Prior to Admission medications   Medication Sig Start Date End Date Taking? Authorizing Provider  citalopram (CELEXA) 10 MG tablet  07/20/17   [provider]  HYDROcodone-acetaminophen (NORCO/VICODIN) 5-325 MG tablet Take 1-2 tablets by mouth every 6 (six) hours as needed. 09/16/19   Harlene SaltsMorelli, Finola Rosal A, PA-C  ondansetron (ZOFRAN ODT) 4 MG disintegrating tablet Take 1 tablet (4 mg total) by mouth every 8 (eight) hours as needed for nausea or vomiting. 09/16/19   Harlene SaltsMorelli, Ejay Lashley A, PA-C  tamsulosin (FLOMAX) 0.4 MG CAPS capsule Take 1 capsule (0.4 mg total) by mouth daily.  09/16/19   Bill SalinasMorelli, Meshelle Holness A, PA-C    Family History Family History  Problem Relation Age of Onset  . Prostate cancer Neg Hx   . Kidney cancer Neg Hx     Social History Social History   Tobacco Use  . Smoking status: Former Games developermoker  . Smokeless tobacco: Never Used  Substance Use Topics  . Alcohol use: Yes  . Drug use: No     Allergies   Patient has no known allergies.   Review of Systems Review of Systems Ten systems are reviewed and are negative for acute change except as noted in the HPI   Physical Exam Updated Vital Signs BP 123/70 (BP Location: Right Arm)   Pulse 91   Temp 98.8 F (37.1 C) (Oral)   Resp 17   Ht 6' (1.829 m)   Wt 76.2 kg   SpO2 97%   BMI 22.78 kg/m   Physical Exam Constitutional:      General: He is not in acute distress.    Appearance: Normal appearance. He is well-developed. He is not ill-appearing or diaphoretic.  HENT:     Head: Normocephalic and atraumatic.     Right Ear: External ear normal.     Left Ear: External ear normal.     Nose: Nose normal.  Eyes:     General: Vision grossly intact. Gaze aligned appropriately.     Pupils: Pupils are equal, round, and reactive to light.  Neck:     Musculoskeletal: Normal range of motion.     Trachea: Trachea and phonation normal. No tracheal deviation.  Pulmonary:  Effort: Pulmonary effort is normal. No respiratory distress.  Abdominal:     General: There is no distension.     Palpations: Abdomen is soft.     Tenderness: There is abdominal tenderness in the left lower quadrant. There is no guarding or rebound.  Genitourinary:    Comments: Deferred by patient Musculoskeletal: Normal range of motion.  Skin:    General: Skin is warm and dry.  Neurological:     Mental Status: He is alert.     GCS: GCS eye subscore is 4. GCS verbal subscore is 5. GCS motor subscore is 6.     Comments: Speech is clear and goal oriented, follows commands Major Cranial nerves without deficit, no  facial droop Moves extremities without ataxia, coordination intact  Psychiatric:        Behavior: Behavior normal.    ED Treatments / Results  Labs (all labs ordered are listed, but only abnormal results are displayed) Labs Reviewed  COMPREHENSIVE METABOLIC PANEL - Abnormal; Notable for the following components:      Result Value   Glucose, Bld 145 (*)    All other components within normal limits  URINALYSIS, ROUTINE W REFLEX MICROSCOPIC - Abnormal; Notable for the following components:   APPearance HAZY (*)    Specific Gravity, Urine 1.031 (*)    Hgb urine dipstick LARGE (*)    Ketones, ur 20 (*)    Protein, ur 100 (*)    RBC / HPF >50 (*)    All other components within normal limits  LIPASE, BLOOD  CBC WITH DIFFERENTIAL/PLATELET    EKG None  Radiology Ct Abdomen Pelvis W Contrast  Result Date: 09/16/2019 CLINICAL DATA:  Acute abdominal pain, fever EXAM: CT ABDOMEN AND PELVIS WITH CONTRAST TECHNIQUE: Multidetector CT imaging of the abdomen and pelvis was performed using the standard protocol following bolus administration of intravenous contrast. CONTRAST:  OMNIPAQUE IOHEXOL 300 MG/ML  SOLN COMPARISON:  None. FINDINGS: Lower chest: No acute abnormality. Hepatobiliary: Diffusely decreased attenuation of the hepatic parenchyma. Fluid density 11 mm lesion is in the inferior right hepatic lobe, likely cyst (series 2, image 36). There are a few additional smaller low-density lesions within the liver, too small to definitively characterize, but most likely represent cysts versus hemangiomas. Gallbladder unremarkable. No intrahepatic biliary dilatation. Pancreas: Unremarkable. No pancreatic ductal dilatation or surrounding inflammatory changes. Spleen: Normal in size without focal abnormality. Adrenals/Urinary Tract: Unremarkable adrenal glands. Lobulated cyst within the midpole of the left kidney. Mild left hydronephrosis. Partially obstructing 6 x 3 mm calculus within the mid left  ureter (series 2, image 68). The right ureter and urinary bladder are unremarkable. No right-sided hydronephrosis. Stomach/Bowel: Stomach is within normal limits. Appendix appears normal. There is scattered colonic diverticulosis. No evidence of bowel wall thickening, distention, or inflammatory changes. Vascular/Lymphatic: No significant vascular findings are present. Mild scattered atherosclerotic calcifications of the aorta. No enlarged abdominal or pelvic lymph nodes. Reproductive: Prostate is unremarkable. Other: No abdominal wall hernia or abnormality. No abdominopelvic ascites. Musculoskeletal: No acute or significant osseous findings. IMPRESSION: 1. Partially obstructing 6 x 3 mm calculus within the mid left ureter causing mild left hydronephrosis. 2. Colonic diverticulosis without evidence of acute diverticulitis. Electronically Signed   By: Duanne Guess M.D.   On: 09/16/2019 14:00    Procedures Procedures (including critical care time)  Medications Ordered in ED Medications  ondansetron (ZOFRAN) injection 4 mg (4 mg Intravenous Given 09/16/19 1232)  morphine 4 MG/ML injection 4 mg (4 mg Intravenous Given  09/16/19 1234)  sodium chloride 0.9 % bolus 500 mL (500 mLs Intravenous New Bag/Given 09/16/19 1232)  iohexol (OMNIPAQUE) 300 MG/ML solution 100 mL (100 mLs Intravenous Contrast Given 09/16/19 1334)  sodium chloride (PF) 0.9 % injection (  Given by Other 09/16/19 1352)     Initial Impression / Assessment and Plan / ED Course  I have reviewed the triage vital signs and the nursing notes.  Pertinent labs & imaging results that were available during my care of the patient were reviewed by me and considered in my medical decision making (see chart for details).  Clinical Course as of Sep 15 1445  Fri Sep 18, 119  3020 68 year old male prior history of diverticulitis is here with left lower quadrant abdominal pain.  He has microscopic hematuria and ultimately his CAT scan showed the left  ureteral stone with pretty good size.  His pain is well controlled now.  We will send him home with some pain medicine and Flomax and he needs to follow-up with his urologist.  Clear return instructions given.   [MB]    Clinical Course User Index [MB] Hayden Rasmussen, MD   CBC within normal limits Urinalysis with large hemoglobin, 20 ketones, 100 protein, and 50 red blood cells; bacteria, WBC, leukocyte and nitrite negative. Lipase within normal limits CMP with glucose of 145 otherwise normal limits - CT AP:  IMPRESSION:  1. Partially obstructing 6 x 3 mm calculus within the mid left  ureter causing mild left hydronephrosis.  2. Colonic diverticulosis without evidence of acute diverticulitis.   Patient reassessed resting comfortably in no acute distress states improvement of pain following morphine today.  He has been informed of results as above and states understanding.  He reports he has a urologist in [place] that he can call today to schedule a follow-up with.  PMP reviewed without any active narcotic prescriptions 6 pills Norco prescribed for acute kidney stone pain Zofran prescribed for nausea/vomiting Flomax prescribed to facilitate stone passage No evidence of infection on urinalysis to necessitate antibiotic therapy at this time - At this time there does not appear to be any evidence of an acute emergency medical condition and the patient appears stable for discharge with appropriate outpatient follow up. Diagnosis was discussed with patient who verbalizes understanding of care plan and is agreeable to discharge. I have discussed return precautions with patient and wife who verbalizes understanding of return precautions. Patient encouraged to follow-up with their PCP and urologist. All questions answered.  Patient has been discharged in good condition.  Patient seen and evaluated by Dr. Melina Copa during this visit who agrees with discharge with Flomax, Norco and outpatient  neurology follow-up.  Note: Portions of this report may have been transcribed using voice recognition software. Every effort was made to ensure accuracy; however, inadvertent computerized transcription errors may still be present. Final Clinical Impressions(s) / ED Diagnoses   Final diagnoses:  Kidney stone    ED Discharge Orders         Ordered    tamsulosin (FLOMAX) 0.4 MG CAPS capsule  Daily     09/16/19 1446    HYDROcodone-acetaminophen (NORCO/VICODIN) 5-325 MG tablet  Every 6 hours PRN     09/16/19 1446    ondansetron (ZOFRAN ODT) 4 MG disintegrating tablet  Every 8 hours PRN     09/16/19 1446           Gari Crown 09/16/19 1447    Hayden Rasmussen, MD 09/16/19 1921

## 2019-09-19 ENCOUNTER — Other Ambulatory Visit: Payer: Self-pay

## 2019-09-19 ENCOUNTER — Encounter (HOSPITAL_BASED_OUTPATIENT_CLINIC_OR_DEPARTMENT_OTHER): Payer: Self-pay

## 2019-09-19 ENCOUNTER — Other Ambulatory Visit: Payer: Self-pay | Admitting: Urology

## 2019-09-19 NOTE — Progress Notes (Signed)
Spoke with:  Jon Terry NPO:  No food after midnight/Clear liquids until 6:45AM DOS Arrival time: 1045AM Labs:(CBC/diff, CMP, UA, Lipase 09/16/2019 chart/epic) AM medications: None Pre op orders: Yes Ride home: Gwinda Passe (wife) [phone]

## 2019-09-19 NOTE — Progress Notes (Signed)
SPOKE W/  Jon Terry     SCREENING SYMPTOMS OF COVID 19:   COUGH--NO  RUNNY NOSE--- NO  SORE THROAT---NO  NASAL CONGESTION----NO  SNEEZING----NO  SHORTNESS OF BREATH---NO  DIFFICULTY BREATHING---NO  TEMP >100.0 -----with kidney stone  UNEXPLAINED BODY ACHES------with kidney stone  CHILLS -------- with kidney stone  HEADACHES ---------NO  LOSS OF SMELL/ TASTE --------NO   HAVE YOU OR ANY FAMILY MEMBER TRAVELLED PAST 14 DAYS OUT OF THE   COUNTY---NO STATE----NO COUNTRY----NO  HAVE YOU OR ANY FAMILY MEMBER BEEN EXPOSED TO ANYONE WITH COVID 19? NO

## 2019-09-20 ENCOUNTER — Emergency Department (HOSPITAL_COMMUNITY)
Admission: EM | Admit: 2019-09-20 | Discharge: 2019-09-20 | Disposition: A | Discharge status: Home / Self Care | Payer: Medicare Other | Attending: Emergency Medicine | Admitting: Emergency Medicine

## 2019-09-20 ENCOUNTER — Other Ambulatory Visit: Payer: Self-pay

## 2019-09-20 ENCOUNTER — Inpatient Hospital Stay (HOSPITAL_COMMUNITY): Admission: RE | Admit: 2019-09-20 | Payer: Medicare Other | Source: Ambulatory Visit

## 2019-09-20 ENCOUNTER — Encounter (HOSPITAL_COMMUNITY): Payer: Self-pay

## 2019-09-20 DIAGNOSIS — Z20828 Contact with and (suspected) exposure to other viral communicable diseases: Secondary | ICD-10-CM | POA: Diagnosis not present

## 2019-09-20 DIAGNOSIS — N200 Calculus of kidney: Secondary | ICD-10-CM | POA: Insufficient documentation

## 2019-09-20 DIAGNOSIS — R1032 Left lower quadrant pain: Secondary | ICD-10-CM | POA: Diagnosis present

## 2019-09-20 LAB — URINALYSIS, ROUTINE W REFLEX MICROSCOPIC
Bacteria, UA: NONE SEEN
Bilirubin Urine: NEGATIVE
Glucose, UA: NEGATIVE mg/dL
Ketones, ur: 20 mg/dL — AB
Leukocytes,Ua: NEGATIVE
Nitrite: NEGATIVE
Protein, ur: NEGATIVE mg/dL
Specific Gravity, Urine: 1.01 (ref 1.005–1.030)
pH: 8 (ref 5.0–8.0)

## 2019-09-20 LAB — CBC
HCT: 48.8 % (ref 39.0–52.0)
Hemoglobin: 16.9 g/dL (ref 13.0–17.0)
MCH: 30.3 pg (ref 26.0–34.0)
MCHC: 34.6 g/dL (ref 30.0–36.0)
MCV: 87.6 fL (ref 80.0–100.0)
Platelets: 226 10*3/uL (ref 150–400)
RBC: 5.57 MIL/uL (ref 4.22–5.81)
RDW: 12.7 % (ref 11.5–15.5)
WBC: 10.4 10*3/uL (ref 4.0–10.5)
nRBC: 0 % (ref 0.0–0.2)

## 2019-09-20 LAB — BASIC METABOLIC PANEL
Anion gap: 14 (ref 5–15)
BUN: 17 mg/dL (ref 8–23)
CO2: 17 mmol/L — ABNORMAL LOW (ref 22–32)
Calcium: 9.7 mg/dL (ref 8.9–10.3)
Chloride: 104 mmol/L (ref 98–111)
Creatinine, Ser: 1.23 mg/dL (ref 0.61–1.24)
GFR calc Af Amer: >60 mL/min (ref >60)
GFR calc non Af Amer: 60 mL/min — ABNORMAL LOW (ref >60)
Glucose, Bld: 130 mg/dL — ABNORMAL HIGH (ref 70–99)
Potassium: 3.5 mmol/L (ref 3.5–5.1)
Sodium: 135 mmol/L (ref 135–145)

## 2019-09-20 LAB — SARS CORONAVIRUS 2 BY RT PCR (HOSPITAL ORDER, PERFORMED IN ~~LOC~~ HOSPITAL LAB): SARS Coronavirus 2: NEGATIVE

## 2019-09-20 MED ORDER — SODIUM CHLORIDE 0.9 % IV BOLUS
1000.0000 mL | Freq: Once | INTRAVENOUS | Status: AC
  Administered 2019-09-20: 1000 mL via INTRAVENOUS

## 2019-09-20 MED ORDER — SODIUM CHLORIDE 0.9 % IV SOLN: Freq: Once | INTRAVENOUS | Status: DC

## 2019-09-20 MED ORDER — ONDANSETRON 4 MG PO TBDP: 4.0000 mg | ORAL_TABLET | Freq: Three times a day (TID) | ORAL | 0 refills | Status: AC

## 2019-09-20 MED ORDER — ONDANSETRON HCL 4 MG/2ML IJ SOLN
4.0000 mg | Freq: Once | INTRAMUSCULAR | Status: AC
  Administered 2019-09-20: 4 mg via INTRAVENOUS
  Filled 2019-09-20: qty 2

## 2019-09-20 MED ORDER — NIFEDIPINE ER OSMOTIC RELEASE 60 MG PO TB24
60.0000 mg | ORAL_TABLET | Freq: Once | ORAL | Status: DC
  Filled 2019-09-20: qty 1

## 2019-09-20 MED ORDER — HYDROMORPHONE HCL 1 MG/ML IJ SOLN
1.0000 mg | Freq: Once | INTRAMUSCULAR | Status: AC
  Administered 2019-09-20: 1 mg via INTRAVENOUS
  Filled 2019-09-20: qty 1

## 2019-09-20 MED ORDER — OXYCODONE-ACETAMINOPHEN 5-325 MG PO TABS: 1.0000 | ORAL_TABLET | Freq: Four times a day (QID) | ORAL | 0 refills | Status: AC | PRN

## 2019-09-20 MED ORDER — KETOROLAC TROMETHAMINE 30 MG/ML IJ SOLN
30.0000 mg | Freq: Four times a day (QID) | INTRAMUSCULAR | Status: DC | PRN
  Administered 2019-09-20: 30 mg via INTRAVENOUS
  Filled 2019-09-20: qty 1

## 2019-09-20 NOTE — ED Provider Notes (Addendum)
[facility]-EMERGENCY DEPT Provider Note   CSN: [id number] Arrival date & time: 09/20/19  4193   History   Chief Complaint Chief Complaint  Patient presents with  . Flank Pain    Left side   HPI Jon Terry is a 68 y.o. male with history of 3x6 mm left-sided renal stone diagnosed 4 days ago. Patient is to have ureteroscopy tomorrow 09/21/2019. His pain suddenly became much more severe this morning, he called 911. He is having severe 8/10 colicky, dull, constant pain to his left side. He took his Norco this morning around 6am but he vomited shortly after taking it. He endorses having nausea, vomiting, states he is unable to keep foods down, abdominal pain, chills, back pain of left side going midline, and constipation.  He denies fevers, body aches of extremities, rashes, joint pains. No recent accidents or trauma.   He last voided a little bit overnight.    Past Medical History:  Diagnosis Date  . Anxiety and depression   . Diverticulosis 09/16/2019  . GERD (gastroesophageal reflux disease)    occ  . History of kidney stones 09/16/2019   Partially obstructing 6 x 3 mm calculus within the mid left ureter causing mild left hydronephrosis  . Peyronie disease   . Pre-diabetes   . Sciatica    left and right greater on right  . Seasonal allergies   . Wears glasses     There are no active problems to display for this patient.   Past Surgical History:  Procedure Laterality Date  . COLONOSCOPY     x2  . CYSTOSCOPY/URETEROSCOPY/HOLMIUM LASER/STENT PLACEMENT Left 09/21/2019   Procedure: CYSTOSCOPY/RETROGRADE/URETEROSCOPY/HOLMIUM LASER/STENT PLACEMENT;  Surgeon: Rene Paci, MD;  Location: [facility];  Service: Urology;  Laterality: Left;  ONLY NEEDS 45 MIN  . HERNIA REPAIR  2010   right inguinal  . WISDOM TOOTH EXTRACTION       Home Medications    Prior to Admission medications   Medication Sig Start Date End Date Taking?  Authorizing Provider  citalopram (CELEXA) 10 MG tablet Take 10 mg by mouth every evening.  07/20/17  Yes [provider]  fluticasone (FLONASE) 50 MCG/ACT nasal spray Place 1 spray into both nostrils daily.   Yes [provider]  oxybutynin (DITROPAN) 5 MG tablet Take 1 tablet (5 mg total) by mouth every 8 (eight) hours as needed for bladder spasms. 09/21/19   Rene Paci, MD  phenazopyridine (PYRIDIUM) 200 MG tablet Take 1 tablet (200 mg total) by mouth 3 (three) times daily as needed (for pain with urination). 09/21/19 09/20/20  Rene Paci, MD    Family History Family History  Problem Relation Age of Onset  . Prostate cancer Neg Hx   . Kidney cancer Neg Hx     Social History Social History   Tobacco Use  . Smoking status: Former Smoker    Packs/day: 0.25    Years: 20.00    Pack years: 5.00    Types: Cigarettes    Quit date: 1991    Years since quitting: 29.7  . Smokeless tobacco: Never Used  . Tobacco comment: off and on  Substance Use Topics  . Alcohol use: Yes    Comment: 5 drinks per week  . Drug use: Not Currently    Types: Marijuana     Allergies   Tamsulosin, Codeine, and Penicillins   Review of Systems Review of Systems  Constitutional: Positive for chills. Negative for fever.  HENT: Negative for sore throat.   Respiratory: Negative for shortness of breath.   Cardiovascular: Negative for chest pain.  Gastrointestinal: Positive for abdominal pain, constipation, nausea and vomiting.  Genitourinary: Positive for difficulty urinating and flank pain.  Musculoskeletal: Positive for back pain. Negative for neck stiffness.  Skin: Negative for rash.  Neurological: Negative for syncope and headaches.   - see HPI   Physical Exam Updated Vital Signs BP 117/73   Pulse 70   Temp 97.7 F (36.5 C) (Oral)   Resp (!) 7   Ht 6' (1.829 m)   Wt 76 kg   SpO2 97%   BMI 22.72 kg/m   Physical Exam Vitals signs and nursing  note reviewed.  Constitutional:      General: He is not in acute distress.    Appearance: He is well-developed. He is not diaphoretic.  HENT:     Head: Normocephalic and atraumatic.  Eyes:     Conjunctiva/sclera: Conjunctivae normal.  Neck:     Musculoskeletal: Normal range of motion.  Cardiovascular:     Rate and Rhythm: Normal rate and regular rhythm.  Pulmonary:     Effort: Pulmonary effort is normal. No respiratory distress.  Abdominal:     General: There is no distension.     Palpations: Abdomen is soft.     Tenderness: There is abdominal tenderness (Left lower and mid-abdomen, left flank). There is no guarding.  Skin:    General: Skin is warm and dry.  Neurological:     Mental Status: He is alert and oriented to person, place, and time.      ED Treatments / Results  Labs (all labs ordered are listed, but only abnormal results are displayed) Labs Reviewed  URINALYSIS, ROUTINE W REFLEX MICROSCOPIC - Abnormal; Notable for the following components:      Result Value   Hgb urine dipstick SMALL (*)    Ketones, ur 20 (*)    All other components within normal limits  BASIC METABOLIC PANEL - Abnormal; Notable for the following components:   CO2 17 (*)    Glucose, Bld 130 (*)    GFR calc non Af Amer 60 (*)    All other components within normal limits  URINE CULTURE  SARS CORONAVIRUS 2 (HOSPITAL ORDER, [place] [facility])  CBC    EKG None  Radiology No results found.  Procedures Procedures (including critical care time)  Medications Ordered in ED Medications  ondansetron (ZOFRAN) injection 4 mg (4 mg Intravenous Given 09/20/19 1059)  sodium chloride 0.9 % bolus 1,000 mL (0 mLs Intravenous Stopped 09/20/19 1356)  HYDROmorphone (DILAUDID) injection 1 mg (1 mg Intravenous Given 09/20/19 1210)     Initial Impression / Assessment and Plan / ED Course  I have reviewed the triage vital signs and the nursing notes.  Pertinent labs & imaging results  that were available during my care of the patient were reviewed by me and considered in my medical decision making (see chart for details).  L. sided kidney stone: CT scan from 4 days prior showing left-sided renal stone measuring 3x30mm.  Vital signs stable, no fever or concerning findings on urinalysis suggestive of pyelonephritis. Abdominal CT negative for diverticulitis and appendicitis. Bowel sounds present on physical exam make bowel obstruction less likely. Lipase from 4 days prior with same presenting symptoms was negative making pancreatitis less likely.  -CBC, CMP, UA w/ Urine Culture -IV NS Bolus 1L -IV Zofran  -IV Toradol 30mg  -Urology consult for  further mgmt guidance -COVID swab pending  Final Clinical Impressions(s) / ED Diagnoses   Final diagnoses:  Kidney stone    ED Discharge Orders         Ordered    oxyCODONE-acetaminophen (PERCOCET/ROXICET) 5-325 MG tablet  Every 6 hours PRN     09/20/19 1314    ondansetron (ZOFRAN ODT) 4 MG disintegrating tablet  Every 8 hours     09/20/19 1314         Peggyann Shoals, DO [facility], PGY-2 10/08/2019 7:20 AM    Dollene Cleveland, DO 09/20/19 1058  68 y.o. male with pmhx as above presenting with difficulty controlling left flank pain secondary to recently diagnosed nephrolithiasis. No sign of infection or other complications.  Discussed with urology who recommends pain control with continued plan for procedure tomorrow.  Patient with improved pain control, appropriate for continued outpatient management with procedure tomorrow.   Alvira Monday, MD 10/08/19 [id number]

## 2019-09-20 NOTE — ED Triage Notes (Signed)
Patient arrived via [facility] from home. Patient is AOx4 and ambulatory. Patient called 911 due to severe left sided flank pain. Patient was diagnosed with renal calculi on September 16, 2019 by [facility] provider. Patient is scheduled for Lithotripsy on 09/21/2019. Patient last took Norco around 0600.  Patient stated he has been producing urine. Patient has not taken prescribed Zofran

## 2019-09-20 NOTE — ED Provider Notes (Signed)
Interim Progress Note  S: Visited patient in his room after receiving Toradol and nausea medication, patient states his pain is now down to 1-2.  Would prefer to wait for tomorrow for his ureteroscopy versus doing emergency stent placement today.  O: BP (!) 109/94   Pulse 87   Temp 97.7 F (36.5 C) (Oral)   Resp (!) 25   Ht 6' (1.829 m)   Wt 76 kg   SpO2 93%   BMI 22.72 kg/m     A/P: Plan to send patient home with dissolvable Zofran and Percocet 5 1-2 tablets every 6 hours as needed for severe pain. Patient with ureteroscopy 9/23 at 12:30  Daisy Floro, DO 09/20/2019, 1:15 PM PGY-2, [place] [facility] pager [phone]    Daisy Floro, DO 09/20/19 1316    Gareth Morgan, MD 09/22/19 1122

## 2019-09-20 NOTE — Discharge Instructions (Addendum)
It was a pleasure to take care of you today!  Take Zofran 4 mg dissolvable tablet under the tongue every 8 hours to reduce nausea and vomiting. Stop taking Norco/hydrocodone.  Start taking Percocet/oxycodone 1-2 tablets every 6 hours as needed for severe pain. Due to your shortness of breath with tamsulosin/Flomax, stop taking this medication. Try your best to stay hydrated by drinking water, sports drinks, and eating soups/broth. Remember not to eat any foods after midnight in preparation for your procedure tomorrow 9/23 at 12:30 in the afternoon.  Milus Banister, [place], PGY-2 09/20/2019 1:49 PM

## 2019-09-20 NOTE — ED Notes (Signed)
ED Provider at bedside with patient. 

## 2019-09-21 ENCOUNTER — Encounter (HOSPITAL_BASED_OUTPATIENT_CLINIC_OR_DEPARTMENT_OTHER): Payer: Self-pay | Admitting: *Deleted

## 2019-09-21 ENCOUNTER — Encounter (HOSPITAL_BASED_OUTPATIENT_CLINIC_OR_DEPARTMENT_OTHER)
Admission: RE | Disposition: A | Discharge status: Home / Self Care | Payer: Self-pay | Source: Home / Self Care | Attending: Urology

## 2019-09-21 ENCOUNTER — Ambulatory Visit (HOSPITAL_BASED_OUTPATIENT_CLINIC_OR_DEPARTMENT_OTHER): Payer: Medicare Other | Admitting: Anesthesiology

## 2019-09-21 ENCOUNTER — Ambulatory Visit (HOSPITAL_BASED_OUTPATIENT_CLINIC_OR_DEPARTMENT_OTHER)
Admission: RE | Admit: 2019-09-21 | Discharge: 2019-09-21 | Disposition: A | Discharge status: Home / Self Care | Payer: Medicare Other | Attending: Urology | Admitting: Urology

## 2019-09-21 DIAGNOSIS — N201 Calculus of ureter: Secondary | ICD-10-CM | POA: Diagnosis present

## 2019-09-21 DIAGNOSIS — Z885 Allergy status to narcotic agent status: Secondary | ICD-10-CM | POA: Insufficient documentation

## 2019-09-21 DIAGNOSIS — Z79899 Other long term (current) drug therapy: Secondary | ICD-10-CM | POA: Insufficient documentation

## 2019-09-21 DIAGNOSIS — F329 Major depressive disorder, single episode, unspecified: Secondary | ICD-10-CM | POA: Insufficient documentation

## 2019-09-21 DIAGNOSIS — Z79891 Long term (current) use of opiate analgesic: Secondary | ICD-10-CM | POA: Insufficient documentation

## 2019-09-21 DIAGNOSIS — Z880 Allergy status to penicillin: Secondary | ICD-10-CM | POA: Diagnosis not present

## 2019-09-21 DIAGNOSIS — N132 Hydronephrosis with renal and ureteral calculous obstruction: Secondary | ICD-10-CM | POA: Diagnosis not present

## 2019-09-21 DIAGNOSIS — Z87891 Personal history of nicotine dependence: Secondary | ICD-10-CM | POA: Insufficient documentation

## 2019-09-21 HISTORY — DX: Presence of spectacles and contact lenses: Z97.3

## 2019-09-21 HISTORY — DX: Induration penis plastica: N48.6

## 2019-09-21 HISTORY — DX: Gastro-esophageal reflux disease without esophagitis: K21.9

## 2019-09-21 HISTORY — PX: CYSTOSCOPY/URETEROSCOPY/HOLMIUM LASER/STENT PLACEMENT: SHX6546

## 2019-09-21 HISTORY — DX: Sciatica, unspecified side: M54.30

## 2019-09-21 HISTORY — DX: Other seasonal allergic rhinitis: J30.2

## 2019-09-21 HISTORY — DX: Prediabetes: R73.03

## 2019-09-21 LAB — URINE CULTURE: Culture: NO GROWTH

## 2019-09-21 SURGERY — CYSTOSCOPY/URETEROSCOPY/HOLMIUM LASER/STENT PLACEMENT
Anesthesia: General | Site: Ureter | Laterality: Left

## 2019-09-21 MED ORDER — PHENAZOPYRIDINE HCL 200 MG PO TABS: 200.0000 mg | ORAL_TABLET | Freq: Three times a day (TID) | ORAL | 0 refills | Status: AC | PRN

## 2019-09-21 MED ORDER — LIDOCAINE 2% (20 MG/ML) 5 ML SYRINGE
INTRAMUSCULAR | Status: AC
  Filled 2019-09-21: qty 5

## 2019-09-21 MED ORDER — CIPROFLOXACIN IN D5W 400 MG/200ML IV SOLN
INTRAVENOUS | Status: AC
  Filled 2019-09-21: qty 200

## 2019-09-21 MED ORDER — MIDAZOLAM HCL 2 MG/2ML IJ SOLN
0.5000 mg | Freq: Once | INTRAMUSCULAR | Status: DC | PRN
  Filled 2019-09-21: qty 2

## 2019-09-21 MED ORDER — PROMETHAZINE HCL 25 MG/ML IJ SOLN
6.2500 mg | INTRAMUSCULAR | Status: DC | PRN
  Filled 2019-09-21: qty 1

## 2019-09-21 MED ORDER — CIPROFLOXACIN HCL 500 MG PO TABS: 500.0000 mg | ORAL_TABLET | Freq: Two times a day (BID) | ORAL | 0 refills | Status: AC

## 2019-09-21 MED ORDER — ACETAMINOPHEN 325 MG PO TABS
ORAL_TABLET | ORAL | Status: DC | PRN
  Administered 2019-09-21: 1000 mg via ORAL

## 2019-09-21 MED ORDER — SUGAMMADEX SODIUM 200 MG/2ML IV SOLN
INTRAVENOUS | Status: DC | PRN
  Administered 2019-09-21: 300 mg via INTRAVENOUS

## 2019-09-21 MED ORDER — ONDANSETRON HCL 4 MG/2ML IJ SOLN
INTRAMUSCULAR | Status: DC | PRN
  Administered 2019-09-21: 4 mg via INTRAVENOUS

## 2019-09-21 MED ORDER — CIPROFLOXACIN IN D5W 400 MG/200ML IV SOLN
400.0000 mg | Freq: Once | INTRAVENOUS | Status: AC
  Administered 2019-09-21: 400 mg via INTRAVENOUS
  Filled 2019-09-21: qty 200

## 2019-09-21 MED ORDER — IOHEXOL 300 MG/ML  SOLN
INTRAMUSCULAR | Status: DC | PRN
  Administered 2019-09-21: 7 mL via URETHRAL

## 2019-09-21 MED ORDER — ROCURONIUM BROMIDE 100 MG/10ML IV SOLN
INTRAVENOUS | Status: DC | PRN
  Administered 2019-09-21: 35 mg via INTRAVENOUS

## 2019-09-21 MED ORDER — MEPERIDINE HCL 25 MG/ML IJ SOLN
6.2500 mg | INTRAMUSCULAR | Status: DC | PRN
  Filled 2019-09-21: qty 1

## 2019-09-21 MED ORDER — DEXAMETHASONE SODIUM PHOSPHATE 10 MG/ML IJ SOLN
INTRAMUSCULAR | Status: DC | PRN
  Administered 2019-09-21: 5 mg via INTRAVENOUS

## 2019-09-21 MED ORDER — PROPOFOL 10 MG/ML IV BOLUS
INTRAVENOUS | Status: AC
  Filled 2019-09-21: qty 40

## 2019-09-21 MED ORDER — FENTANYL CITRATE (PF) 100 MCG/2ML IJ SOLN
INTRAMUSCULAR | Status: AC
  Filled 2019-09-21: qty 2

## 2019-09-21 MED ORDER — DEXAMETHASONE SODIUM PHOSPHATE 10 MG/ML IJ SOLN
INTRAMUSCULAR | Status: AC
  Filled 2019-09-21: qty 1

## 2019-09-21 MED ORDER — FENTANYL CITRATE (PF) 100 MCG/2ML IJ SOLN
INTRAMUSCULAR | Status: DC | PRN
  Administered 2019-09-21 (×2): 50 ug via INTRAVENOUS

## 2019-09-21 MED ORDER — LIDOCAINE 2% (20 MG/ML) 5 ML SYRINGE
INTRAMUSCULAR | Status: DC | PRN
  Administered 2019-09-21: 40 mg via INTRAVENOUS

## 2019-09-21 MED ORDER — LACTATED RINGERS IV SOLN
INTRAVENOUS | Status: DC
  Administered 2019-09-21 (×2): via INTRAVENOUS
  Filled 2019-09-21: qty 1000

## 2019-09-21 MED ORDER — PROPOFOL 10 MG/ML IV BOLUS
INTRAVENOUS | Status: DC | PRN
  Administered 2019-09-21: 200 mg via INTRAVENOUS

## 2019-09-21 MED ORDER — OXYBUTYNIN CHLORIDE 5 MG PO TABS: 5.0000 mg | ORAL_TABLET | Freq: Three times a day (TID) | ORAL | 1 refills | Status: DC | PRN

## 2019-09-21 MED ORDER — FENTANYL CITRATE (PF) 100 MCG/2ML IJ SOLN
25.0000 ug | INTRAMUSCULAR | Status: DC | PRN
  Filled 2019-09-21: qty 1

## 2019-09-21 MED ORDER — KETOROLAC TROMETHAMINE 30 MG/ML IJ SOLN
INTRAMUSCULAR | Status: AC
  Filled 2019-09-21: qty 1

## 2019-09-21 MED ORDER — ACETAMINOPHEN 500 MG PO TABS
ORAL_TABLET | ORAL | Status: AC
  Filled 2019-09-21: qty 2

## 2019-09-21 MED ORDER — SUCCINYLCHOLINE CHLORIDE 200 MG/10ML IV SOSY
PREFILLED_SYRINGE | INTRAVENOUS | Status: DC | PRN
  Administered 2019-09-21: 100 mg via INTRAVENOUS

## 2019-09-21 SURGICAL SUPPLY — 36 items
APL SKNCLS STERI-STRIP NONHPOA (GAUZE/BANDAGES/DRESSINGS)
BAG DRAIN URO-CYSTO SKYTR STRL (DRAIN) ×3 IMPLANT
BAG DRN UROCATH (DRAIN) ×1
BASKET STONE 1.7 NGAGE (UROLOGICAL SUPPLIES) IMPLANT
BASKET ZERO TIP NITINOL 2.4FR (BASKET) ×3 IMPLANT
BENZOIN TINCTURE PRP APPL 2/3 (GAUZE/BANDAGES/DRESSINGS) IMPLANT
BSKT STON RTRVL ZERO TP 2.4FR (BASKET) ×1
CATH URET 5FR 28IN OPEN ENDED (CATHETERS) ×3 IMPLANT
CLOSURE WOUND 1/2 X4 (GAUZE/BANDAGES/DRESSINGS)
CLOTH BEACON ORANGE TIMEOUT ST (SAFETY) ×3 IMPLANT
FIBER LASER FLEXIVA 365 (UROLOGICAL SUPPLIES) IMPLANT
FIBER LASER TRAC TIP (UROLOGICAL SUPPLIES) ×3 IMPLANT
GLOVE BIO SURGEON STRL SZ 6.5 (GLOVE) IMPLANT
GLOVE BIO SURGEON STRL SZ7.5 (GLOVE) ×3 IMPLANT
GLOVE BIO SURGEONS STRL SZ 6.5 (GLOVE)
GLOVE BIOGEL PI IND STRL 6 (GLOVE) ×2 IMPLANT
GLOVE BIOGEL PI INDICATOR 6 (GLOVE) ×4
GLOVE SURG SYN 6.5 ES PF (GLOVE) ×3 IMPLANT
GOWN STRL REUS W/ TWL LRG LVL3 (GOWN DISPOSABLE) ×1 IMPLANT
GOWN STRL REUS W/TWL LRG LVL3 (GOWN DISPOSABLE) ×3
GOWN STRL REUS W/TWL XL LVL3 (GOWN DISPOSABLE) ×6 IMPLANT
GUIDEWIRE STR DUAL SENSOR (WIRE) IMPLANT
GUIDEWIRE ZIPWRE .038 STRAIGHT (WIRE) ×3 IMPLANT
IV NS 1000ML (IV SOLUTION)
IV NS 1000ML BAXH (IV SOLUTION) IMPLANT
IV NS IRRIG 3000ML ARTHROMATIC (IV SOLUTION) ×3 IMPLANT
KIT TURNOVER CYSTO (KITS) ×3 IMPLANT
MANIFOLD NEPTUNE II (INSTRUMENTS) ×3 IMPLANT
NS IRRIG 500ML POUR BTL (IV SOLUTION) ×3 IMPLANT
PACK CYSTO (CUSTOM PROCEDURE TRAY) ×3 IMPLANT
STENT URET 6FRX24 CONTOUR (STENTS) ×3 IMPLANT
STRIP CLOSURE SKIN 1/2X4 (GAUZE/BANDAGES/DRESSINGS) IMPLANT
SYR 10ML LL (SYRINGE) ×3 IMPLANT
TUBE CONNECTING 12'X1/4 (SUCTIONS) ×1
TUBE CONNECTING 12X1/4 (SUCTIONS) ×2 IMPLANT
TUBING UROLOGY SET (TUBING) ×3 IMPLANT

## 2019-09-21 NOTE — H&P (Signed)
Jon Terry  MRN: [id number]  DOB: [birth year], 68 year old Male  PRIMARY CARE:  Dibas Koirala, MD  REFERRING:  Darrow Bussing, MD  PROVIDER:  Rhoderick Moody, M.D.  LOCATION:  [facility] [phone]    CC: I have kidney stones.  HPI: Jon Terry is a 68 year-old male established patient who is here for renal calculi.  The problem is on the left side. He first stated noticing pain on approximately 05/30/2019. This is his first kidney stone. He is currently having flank pain, nausea, vomiting, fever, and chills. He denies having back pain and groin pain. He has not caught a stone in his urine strainer since his symptoms began.   He has never had surgical treatment for calculi in the past.   -the patient was seen in the ED on 09/16/19 for left flank pain and was found to have a 6x3 mm left mid-ureteral stone with mild hydronephrosis.  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 This location is not a location that is FDA approved for the use of Xiaflex I also told him that that was the case. Medications can be used off label and this location and use would be considered off label. That may be an issue with his insurance but I told him I wasn't sure of that. We did discuss the fact that there may be an increased risk of bleeding and ecchymoses because of the location of the plaque and the corpus spongiosum. Finally I told  him that the wasting that has occurred is not something that is typically remedied with the Xiaflex although there may be some improvement seen that is not what last flex was designed to treat. We then discussed surgical options. It appears he was told he would not be a good candidate for surgery and I believe this was in reference to plaque incision and patch grafting which does carry an increased risk of development of erectile dysfunction and would be more challenging due to the location of his plaque. 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 - 2018, - 2018 ED due to arterial insufficiency - 2018    NON-GU PMH: Anxiety Depression    FAMILY HISTORY: 2 daughters - Daughter Death In The Family Father - Mother, Father   SOCIAL HISTORY: Marital Status: Married Preferred Language: English; Ethnicity: Not Hispanic Or Latino; Race: White Current Smoking Status: Patient does not smoke anymore. Smoked for 28 years.   Tobacco Use Assessment Completed: Used Tobacco in last 30 days? Drinks 1 drink per day.  REVIEW OF SYSTEMS:    GU Review Male:   Patient reports get up at night to urinate. Patient denies frequent urination, hard to postpone urination, burning/ pain with urination, leakage of urine, stream starts and stops, trouble starting your stream, have to strain to urinate , erection problems, and penile pain.  Gastrointestinal (Upper):   Patient reports vomiting and nausea. Patient denies indigestion/ heartburn.  Gastrointestinal (Lower):   Patient denies diarrhea and constipation.  Constitutional:   Patient denies fever, night sweats, weight loss, and  fatigue.  Skin:   Patient denies skin rash/ lesion and itching.  Eyes:   Patient denies blurred vision and double vision.  Ears/ Nose/ Throat:   Patient denies sore throat and sinus problems.  Hematologic/Lymphatic:   Patient denies swollen glands and easy bruising.  Cardiovascular:   Patient denies leg swelling and chest pains.  Respiratory:   Patient denies cough and shortness of breath.  Endocrine:   Patient denies excessive thirst.  Musculoskeletal:   Patient denies back pain and joint pain.  Neurological:   Patient reports dizziness. Patient denies headaches.  Psychologic:   Patient denies depression and anxiety.   Notes: L side Abdomen pain, 3rd kidney issue since June, kidney stone 81mm    VITAL SIGNS:      09/19/2019 12:50 PM  Weight 168 lb / 76.2 kg  Height 72 in / 182.88 cm  BP 129/87 mmHg  Heart Rate 90 /min  Temperature 97.7 F / 36.5 C  BMI 22.8 kg/m   GU PHYSICAL EXAMINATION:    Anus and Perineum: No hemorrhoids. No anal stenosis. No rectal fissure, no anal fissure. No edema, no dimple, no perineal tenderness, no anal tenderness.  Scrotum: No lesions. No edema. No cysts. No warts.  Epididymides: Right: no spermatocele, no masses, no cysts, no tenderness, no induration, no enlargement. Left: no spermatocele, no masses, no cysts, no tenderness, no induration, no enlargement.  Testes: No tenderness, no swelling, no enlargement left testes. No tenderness, no swelling, no enlargement right testes. Normal location left testes. Normal location right testes. No mass, no cyst, no varicocele, no hydrocele left testes. No mass, no cyst, no varicocele, no hydrocele right testes.  Urethral Meatus: Normal size. No lesion, no wart, no discharge, no polyp. Normal location.  Penis: Circumcised, no warts, no cracks. No dorsal Peyronie's plaques, no left corporal Peyronie's plaques, no right corporal Peyronie's plaques, no scarring, no warts. No balanitis, no meatal stenosis.  Prostate: 40  gram or 2+ size. Left lobe normal consistency, right lobe normal consistency. Symmetrical lobes. No prostate nodule. Left lobe no tenderness, right lobe no tenderness.  Seminal Vesicles: Nonpalpable.  Sphincter Tone: Normal sphincter. No rectal tenderness. No rectal mass.    MULTI-SYSTEM PHYSICAL EXAMINATION:    Constitutional: Well-nourished. No physical deformities. Normally developed. Good grooming.  Neck: Neck symmetrical, not swollen. Normal tracheal position.  Respiratory: No labored breathing, no use of accessory muscles.   Cardiovascular: Normal temperature, normal extremity pulses, no swelling, no varicosities.  Lymphatic: No enlargement of neck, axillae, groin.  Skin: No paleness, no jaundice, no cyanosis. No lesion, no ulcer, no rash.  Neurologic / Psychiatric: Oriented to time, oriented to place, oriented to person. No depression, no anxiety, no agitation.  Gastrointestinal: No mass, no tenderness, no rigidity, non obese abdomen.  Eyes: Normal conjunctivae. Normal eyelids.  Ears, Nose, Mouth, and Throat: Left ear no scars, no lesions, no masses. Right ear no scars, no lesions, no masses. Nose no scars, no lesions, no masses.  Normal hearing. Normal lips.  Musculoskeletal: Normal gait and station of head and neck.     PAST DATA REVIEWED:  Source Of History:  Patient  Notes:                     CLINICAL DATA: Acute abdominal pain, fever     EXAM:  CT ABDOMEN AND PELVIS WITH CONTRAST     TECHNIQUE:  Multidetector CT imaging of the abdomen and pelvis was performed  using the standard protocol following bolus administration of  intravenous contrast.     CONTRAST: OMNIPAQUE IOHEXOL 300 MG/ML SOLN     COMPARISON: None.     FINDINGS:  Lower chest: No acute abnormality.     Hepatobiliary: Diffusely decreased attenuation of the hepatic  parenchyma. Fluid density 11 mm lesion is in the inferior right  hepatic lobe, likely cyst (series 2, image 36). There are a few   additional smaller low-density lesions within the liver, too small  to definitively characterize, but most likely represent cysts versus  hemangiomas. Gallbladder unremarkable. No intrahepatic biliary  dilatation.     Pancreas: Unremarkable. No pancreatic ductal dilatation or  surrounding inflammatory changes.     Spleen: Normal in size without focal abnormality.     Adrenals/Urinary Tract: Unremarkable adrenal glands. Lobulated cyst  within the midpole of the left kidney. Mild left hydronephrosis.  Partially obstructing 6 x 3 mm calculus within the mid left ureter  (series 2, image 68). The right ureter and urinary bladder are  unremarkable. No right-sided hydronephrosis.     Stomach/Bowel: Stomach is within normal limits. Appendix appears  normal. There is scattered colonic diverticulosis. No evidence of  bowel wall thickening, distention, or inflammatory changes.     Vascular/Lymphatic: No significant vascular findings are present.  Mild scattered atherosclerotic calcifications of the aorta. No  enlarged abdominal or pelvic lymph nodes.     Reproductive: Prostate is unremarkable.     Other: No abdominal wall hernia or abnormality. No abdominopelvic  ascites.     Musculoskeletal: No acute or significant osseous findings.     IMPRESSION:  1. Partially obstructing 6 x 3 mm calculus within the mid left  ureter causing mild left hydronephrosis.  2. Colonic diverticulosis without evidence of acute diverticulitis.        Electronically Signed  By: Duanne Guess M.D.  On: 09/16/2019 14:00   PROCEDURES:          Urinalysis Dipstick Dipstick Cont'd  Color: Yellow Bilirubin: Neg mg/dL  Appearance: Clear Ketones: Neg mg/dL  Specific Gravity: 3.664 Blood: Neg ery/uL  pH: 6.0 Protein: Trace mg/dL  Glucose: Neg mg/dL Urobilinogen: 0.2 mg/dL    Nitrites: Neg    Leukocyte Esterase: Neg leu/uL    ASSESSMENT:      ICD-10 Details  1 GU:   Ureteral calculus - N20.1 Left  2    Renal colic - N23 Left   PLAN:           Schedule Return Visit/Planned Activity: ASAP - Schedule Surgery          Document Letter(s):  Created for Patient: Clinical Summary         Notes:   The risks, benefits and alternatives of cystoscopy with LEFT ureteroscopy, laser lithotripsy and ureteral stent placement was discussed the patient. Risks included, but are not limited to: bleeding, urinary tract infection, ureteral injury/avulsion, ureteral stricture formation, retained stone fragments, the possibility that multiple surgeries may be required to treat the stone(s),  MI, stroke, PE and the inherent risks of general anesthesia. The patient voices understanding and wishes to proceed.     Signed by Rhoderick Moodyhristopher Winter, M.D. on 09/19/19 at 1:10 PM (EDT)

## 2019-09-21 NOTE — Anesthesia Postprocedure Evaluation (Signed)
Anesthesia Post Note  Patient: BRACY PEPPER  Procedure(s) Performed: CYSTOSCOPY/RETROGRADE/URETEROSCOPY/HOLMIUM LASER/STENT PLACEMENT (Left Ureter)     Patient location during evaluation: PACU Anesthesia Type: General Level of consciousness: awake and alert, patient cooperative and oriented Pain management: pain level controlled Vital Signs Assessment: post-procedure vital signs reviewed and stable Respiratory status: spontaneous breathing, nonlabored ventilation and respiratory function stable Cardiovascular status: blood pressure returned to baseline and stable Postop Assessment: no apparent nausea or vomiting Anesthetic complications: no    Last Vitals:  Vitals:   09/21/19 1442 09/21/19 1445  BP: 127/83 128/84  Pulse: 85 84  Resp: 10 13  Temp:    SpO2: 99% 100%    Last Pain:  Vitals:   09/21/19 1110  TempSrc: Oral                 Ulyess Muto,E. Esiah Bazinet

## 2019-09-21 NOTE — Transfer of Care (Signed)
Immediate Anesthesia Transfer of Care Note  Patient: Jon Terry  Procedure(s) Performed: CYSTOSCOPY/RETROGRADE/URETEROSCOPY/HOLMIUM LASER/STENT PLACEMENT (Left Ureter)  Patient Location:   Anesthesia Type:General  Level of Consciousness: awake, alert , oriented and patient cooperative  Airway & Oxygen Therapy: Patient Spontanous Breathing and Patient connected to nasal cannula oxygen  Post-op Assessment: Report given to RN and Post -op Vital signs reviewed and stable  Post vital signs: Reviewed and stable  Last Vitals:  Vitals Value Taken Time  BP    Temp    Pulse 82 09/21/19 1443  Resp 11 09/21/19 1443  SpO2 100 % 09/21/19 1443  Vitals shown include unvalidated device data.  Last Pain:  Vitals:   09/21/19 1110  TempSrc: Oral         Complications: No apparent anesthesia complications

## 2019-09-21 NOTE — Op Note (Signed)
Operative Note  Preoperative diagnosis:  1.  5 mm left mid ureteral calculus  Postoperative diagnosis: 1.  5 mm left mid ureteral calculus  Procedure(s): 1.  Cystoscopy with left ureteroscopy, holmium laser lithotripsy and left JJ stent placement 2.  Left retrograde pyelogram with intraoperative interpretation of fluoroscopic imaging  Surgeon: Ellison Hughs, MD  Assistants:  None  Anesthesia:  General  Complications:  None  EBL: LESS than 5 mL  Specimens: 1.  Left ureteral stone  Drains/Catheters: 1.  Left 6 French, 24 cm JJ stent without tether  Intraoperative findings:   1. 5 mm left mid ureteral stone 2. Solitary left collecting system with no filling defects or dilation involving the left ureter or left renal pelvis seen on retrograde pyelogram  Indication:  Jon Terry is a 68 y.o. male with a 5 mm left mid ureteral stone associated with left renal colic.  He has been consented for the above procedures, voices understanding and wishes to proceed.  Description of procedure:  After informed consent was obtained, the patient was brought to the operating room and general LMA anesthesia was administered. The patient was then placed in the dorsolithotomy position and prepped and draped in the usual sterile fashion. A timeout was performed. A 23 French rigid cystoscope was then inserted into the urethral meatus and advanced into the bladder under direct vision. A complete bladder survey revealed no intravesical pathology.  A 5 French ureteral catheter was then inserted into the left ureteral orifice and a retrograde pyelogram was obtained, with the findings listed above.  A Glidewire was then used to intubate the lumen of the ureteral catheter and was advanced up to the left renal pelvis, under fluoroscopic guidance.  The catheter was then removed, leaving the wire in place.  A semirigid ureteroscope was then advanced alongside the ureteral wire where his stone was  identified.  A 200 m holmium laser was then used to fracture the stone into several smaller pieces.  A 0 tip basket was then used to extract all stone fragments from the lumen of the ureter.  The cystoscope was then readvanced over the wire and into the bladder.  A 6 French, 24 cm JJ stent was then placed over the wire and into good position within the left collecting system, confirming placement via fluoroscopy.  The patient's bladder was drained and all stone fragments were removed.  He tolerated the procedure well and was transferred to the postanesthesia in stable condition.  Plan: The patient will follow-up on 09/28/2019 for office cystoscopy and stent removal

## 2019-09-21 NOTE — Anesthesia Preprocedure Evaluation (Addendum)
Anesthesia Evaluation  Patient identified by MRN, date of birth, ID band Patient awake    Reviewed: Allergy & Precautions, NPO status , Patient's Chart, lab work & pertinent test results  History of Anesthesia Complications Negative for: history of anesthetic complications  Airway Mallampati: II  TM Distance: >3 FB Neck ROM: Full    Dental  (+) Dental Advisory Given, Caps   Pulmonary Patient abstained from smoking., former smoker (quit 1991),  09/20/2019 SARS coronavirus NEG   breath sounds clear to auscultation       Cardiovascular (-) hypertension(-) anginanegative cardio ROS   Rhythm:Regular Rate:Normal     Neuro/Psych Anxiety Depression negative neurological ROS     GI/Hepatic Neg liver ROS, GERD  Poorly Controlled,N/v with this kidney stone   Endo/Other  negative endocrine ROS  Renal/GU stones     Musculoskeletal   Abdominal   Peds  Hematology negative hematology ROS (+)   Anesthesia Other Findings   Reproductive/Obstetrics                           Anesthesia Physical Anesthesia Plan  ASA: II  Anesthesia Plan: General   Post-op Pain Management:    Induction: Intravenous and Rapid sequence  PONV Risk Score and Plan: 2 and Ondansetron and Dexamethasone  Airway Management Planned: Oral ETT  Additional Equipment:   Intra-op Plan:   Post-operative Plan: Extubation in OR  Informed Consent: I have reviewed the patients History and Physical, chart, labs and discussed the procedure including the risks, benefits and alternatives for the proposed anesthesia with the patient or authorized representative who has indicated his/her understanding and acceptance.     Dental advisory given  Plan Discussed with: CRNA and Surgeon  Anesthesia Plan Comments:        Anesthesia Quick Evaluation

## 2019-09-21 NOTE — Anesthesia Procedure Notes (Signed)
Procedure Name: Intubation Date/Time: 09/21/2019 1:47 PM Performed by: Wanita Chamberlain, CRNA Pre-anesthesia Checklist: Patient identified, Emergency Drugs available, Suction available and Patient being monitored Patient Re-evaluated:Patient Re-evaluated prior to induction Oxygen Delivery Method: Circle system utilized Preoxygenation: Pre-oxygenation with 100% oxygen Induction Type: Cricoid Pressure applied and Rapid sequence Laryngoscope Size: Mac and 3 Grade View: Grade I Tube size: 7.5 mm Number of attempts: 1 Airway Equipment and Method: Stylet Placement Confirmation: breath sounds checked- equal and bilateral,  CO2 detector,  positive ETCO2 and ETT inserted through vocal cords under direct vision Secured at: 22 cm Tube secured with: Tape Dental Injury: Teeth and Oropharynx as per pre-operative assessment

## 2019-09-21 NOTE — Discharge Instructions (Signed)

## 2019-09-23 ENCOUNTER — Encounter (HOSPITAL_BASED_OUTPATIENT_CLINIC_OR_DEPARTMENT_OTHER): Payer: Self-pay | Admitting: Urology

## 2019-12-07 ENCOUNTER — Other Ambulatory Visit: Payer: Self-pay

## 2019-12-07 DIAGNOSIS — Z20822 Contact with and (suspected) exposure to covid-19: Secondary | ICD-10-CM

## 2019-12-09 LAB — NOVEL CORONAVIRUS, NAA: SARS-CoV-2, NAA: NOT DETECTED

## 2020-02-11 ENCOUNTER — Ambulatory Visit: Payer: Medicare Other | Attending: Internal Medicine

## 2020-02-11 DIAGNOSIS — Z23 Encounter for immunization: Secondary | ICD-10-CM | POA: Insufficient documentation

## 2020-02-11 NOTE — Progress Notes (Signed)
   Covid-19 Vaccination Clinic  Name:  Jon Terry    MRN: [id number] DOB: 1951/03/03  02/11/2020  Jon Terry was observed post Covid-19 immunization for 30 minutes based on pre-vaccination screening without incidence. He was provided with Vaccine Information Sheet and instruction to access the V-Safe system.   Jon Terry was instructed to call 911 with any severe reactions post vaccine: Marland Kitchen Difficulty breathing  . Swelling of your face and throat  . A fast heartbeat  . A bad rash all over your body  . Dizziness and weakness    Immunizations Administered    Name Date Dose VIS Date Route   Pfizer COVID-19 Vaccine 02/11/2020  2:35 PM 0.3 mL 12/09/2019 Intramuscular   Manufacturer: [employer], [employer]   Lot: [id number]   NDC: [address]-1

## 2020-03-05 ENCOUNTER — Ambulatory Visit: Payer: Medicare Other | Attending: Internal Medicine

## 2020-03-05 DIAGNOSIS — Z23 Encounter for immunization: Secondary | ICD-10-CM | POA: Insufficient documentation

## 2020-03-05 NOTE — Progress Notes (Signed)
   Covid-19 Vaccination Clinic  Name:  Jon Terry    MRN: [id number] DOB: Jan 04, 1951  03/05/2020  Jon Terry was observed post Covid-19 immunization for 15 minutes without incident. He was provided with Vaccine Information Sheet and instruction to access the V-Safe system.   Jon Terry was instructed to call 911 with any severe reactions post vaccine: Marland Kitchen Difficulty breathing  . Swelling of face and throat  . A fast heartbeat  . A bad rash all over body  . Dizziness and weakness   Immunizations Administered    Name Date Dose VIS Date Route   Pfizer COVID-19 Vaccine 03/05/2020 12:26 PM 0.3 mL 12/09/2019 Intramuscular   Manufacturer: [employer], [employer]   Lot: [id number]   NDC: [address]-8

## 2020-07-01 IMAGING — CT CT ABD-PELV W/ CM
2 of 5 series · 15 of 46 positions shown, 17 images · IV contrast (OMNIPAQUE 300)
Comparison: None.

CLINICAL DATA: Acute abdominal pain, fever

EXAM:
CT ABDOMEN AND PELVIS WITH CONTRAST
TECHNIQUE: Multidetector CT imaging of the abdomen and pelvis was performed
using the standard protocol following bolus administration of
intravenous contrast.
CONTRAST:  100mL OMNIPAQUE IOHEXOL 300 MG/ML  SOLN

[Series 2: axial st · axial · 0.68mm/px · z∈[-509,-99]mm · 12 of 96 slices shown, 14 images]
[im 7/96  soft-tissue]
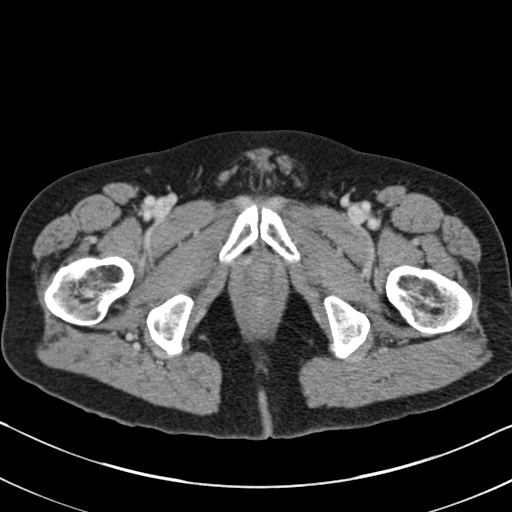
[im 7/96  bone]
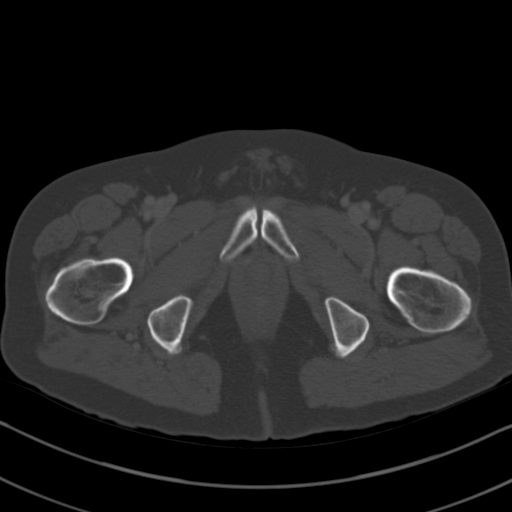
[im 14/96  soft-tissue]
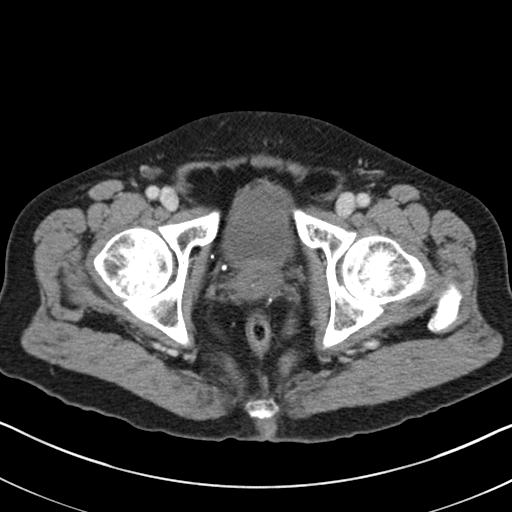
[im 21/96  soft-tissue]
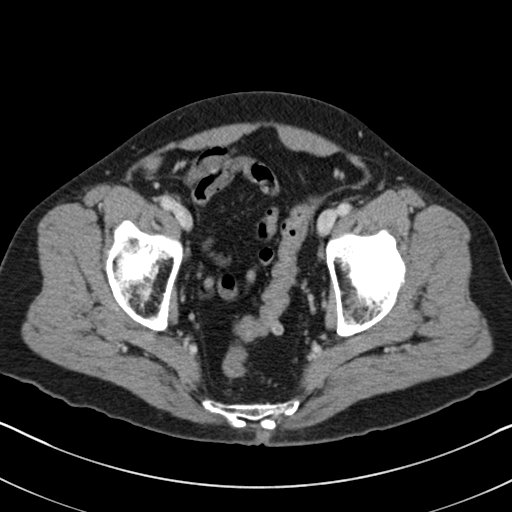
[im 28/96  soft-tissue]
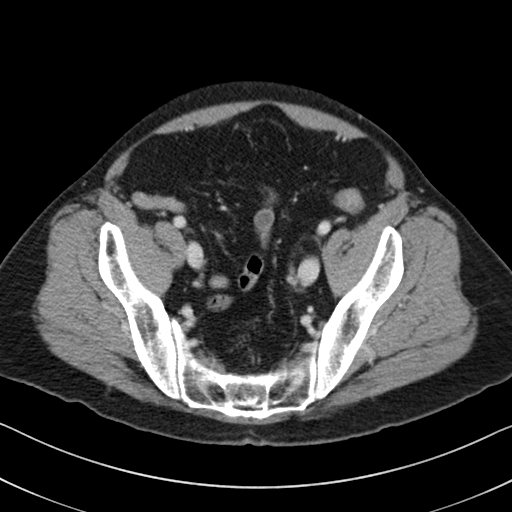
[im 34/96  soft-tissue]
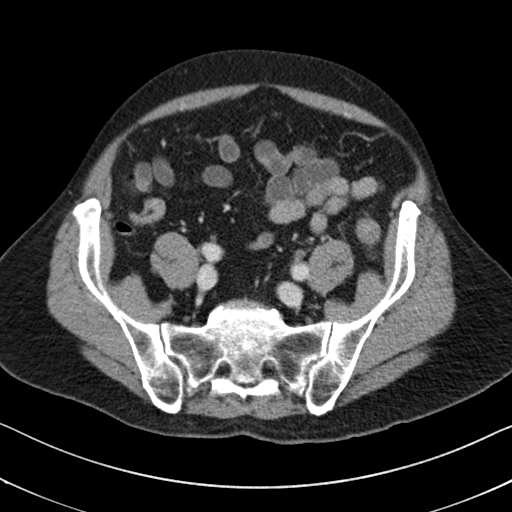
[im 41/96  soft-tissue]
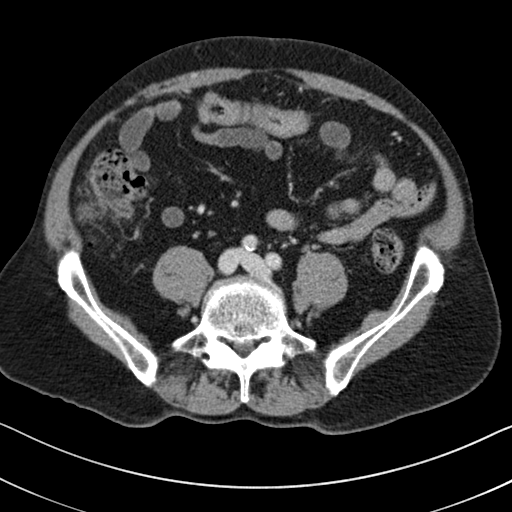
[im 55/96  soft-tissue]
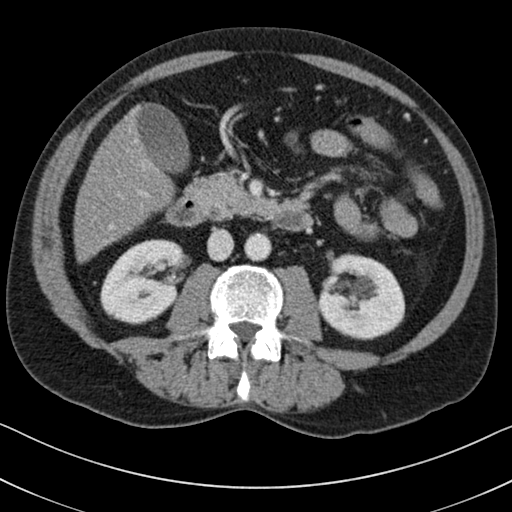
[im 62/96  soft-tissue]
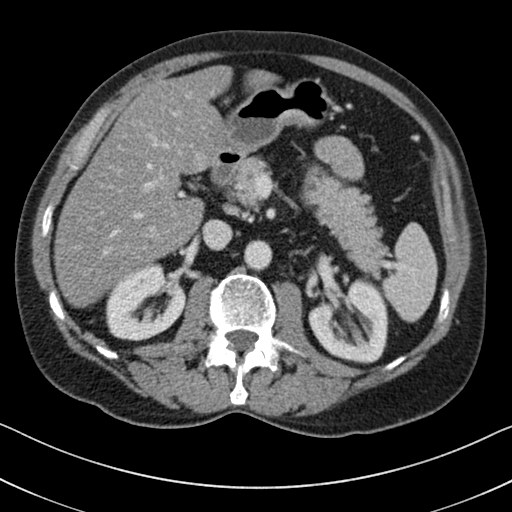
[im 68/96  soft-tissue]
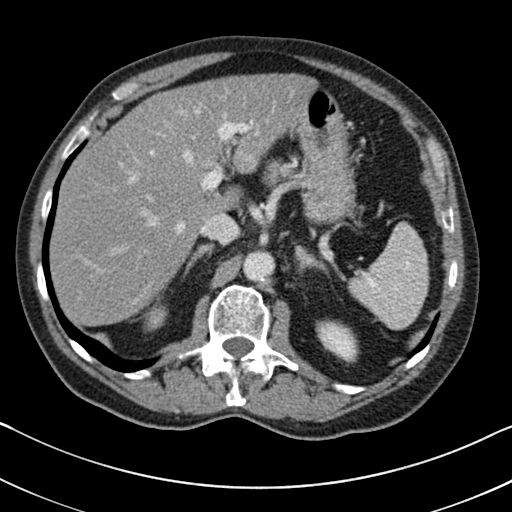
[im 68/96  bone]
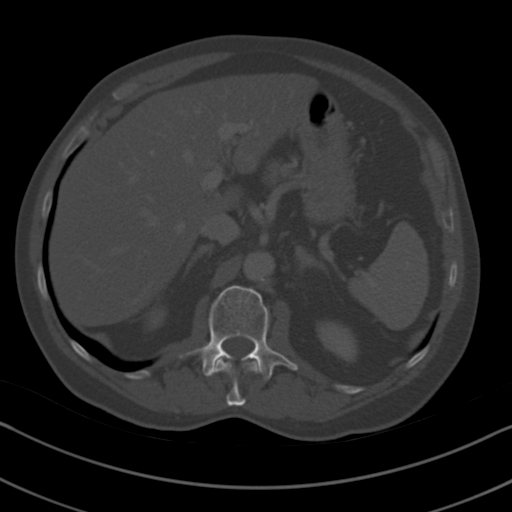
[im 75/96  soft-tissue]
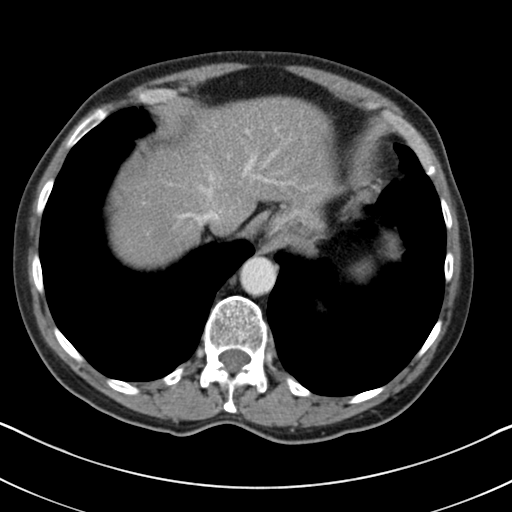
[im 82/96  soft-tissue]
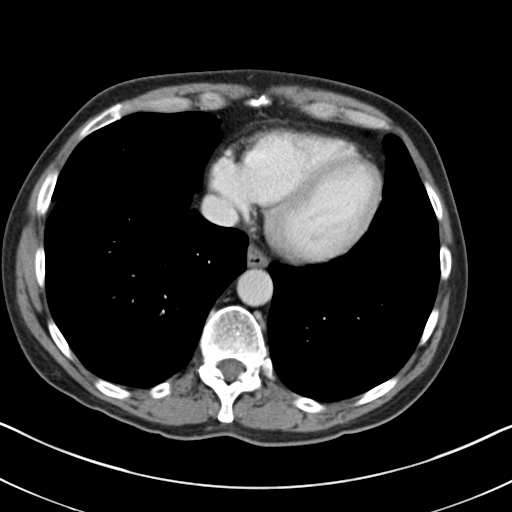
[im 89/96  soft-tissue]
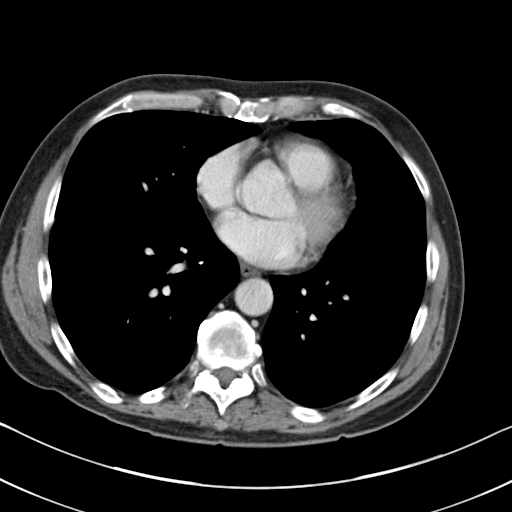

[Series 5: coronal st · coronal · 0.66mm/px · 3 of 150 slices shown]
[im 50/150  soft-tissue]
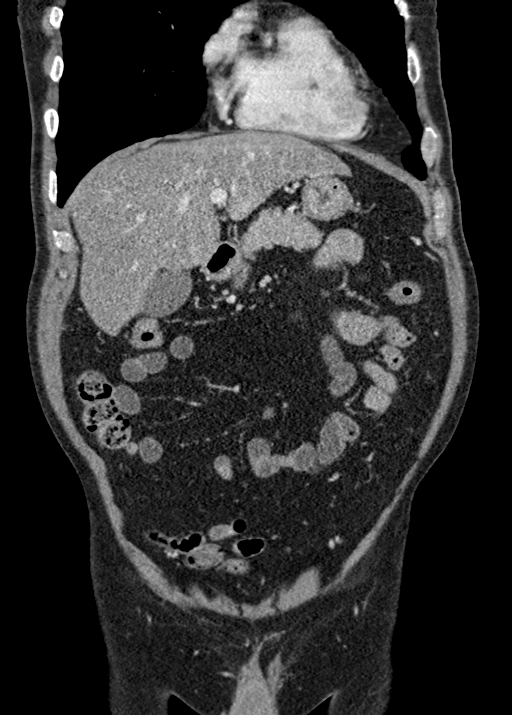
[im 67/150  soft-tissue]
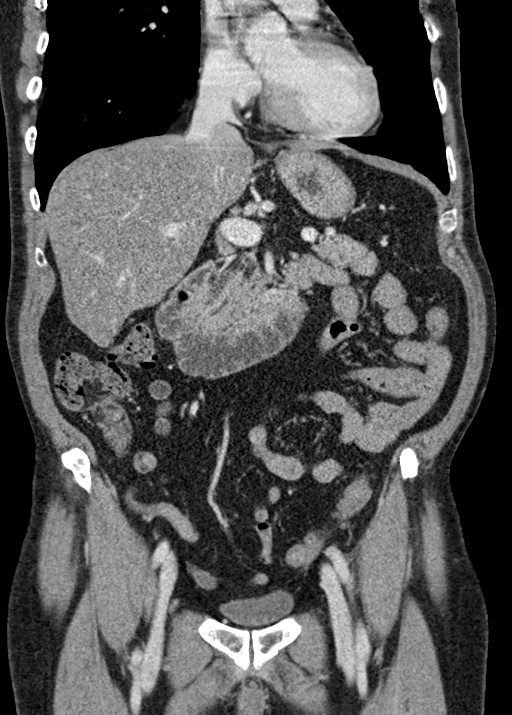
[im 83/150  soft-tissue]
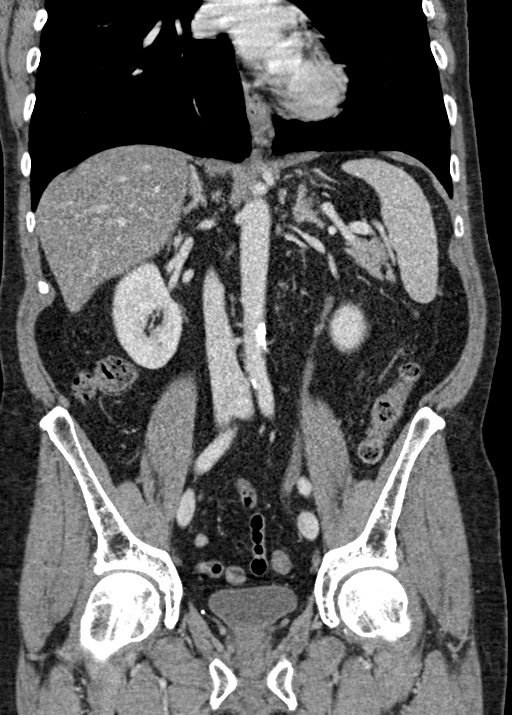

[15 of 46 positions shown; findings below may reference images not displayed]

FINDINGS: Lower chest: No acute abnormality.

Hepatobiliary: Diffusely decreased attenuation of the hepatic
parenchyma. Fluid density 11 mm lesion is in the inferior right
hepatic lobe, likely cyst (series 2, image 36). There are a few
additional smaller low-density lesions within the liver, too small
to definitively characterize, but most likely represent cysts versus
hemangiomas. Gallbladder unremarkable. No intrahepatic biliary
dilatation.

Pancreas: Unremarkable. No pancreatic ductal dilatation or
surrounding inflammatory changes.

Spleen: Normal in size without focal abnormality.

Adrenals/Urinary Tract: Unremarkable adrenal glands. Lobulated cyst
within the midpole of the left kidney. Mild left hydronephrosis.
Partially obstructing 6 x 3 mm calculus within the mid left ureter
(series 2, image 68). The right ureter and urinary bladder are
unremarkable. No right-sided hydronephrosis.

Stomach/Bowel: Stomach is within normal limits. Appendix appears
normal. There is scattered colonic diverticulosis. No evidence of
bowel wall thickening, distention, or inflammatory changes.

Vascular/Lymphatic: No significant vascular findings are present.
Mild scattered atherosclerotic calcifications of the aorta. No
enlarged abdominal or pelvic lymph nodes.

Reproductive: Prostate is unremarkable.

Other: No abdominal wall hernia or abnormality. No abdominopelvic
ascites.

Musculoskeletal: No acute or significant osseous findings.
IMPRESSION: 1. Partially obstructing 6 x 3 mm calculus within the mid left
ureter causing mild left hydronephrosis.
2. Colonic diverticulosis without evidence of acute diverticulitis.

## 2020-07-14 LAB — COLOGUARD: COLOGUARD: NEGATIVE

## 2020-09-12 ENCOUNTER — Encounter: Payer: Self-pay | Admitting: Physical Therapy

## 2020-09-12 ENCOUNTER — Other Ambulatory Visit: Payer: Self-pay

## 2020-09-12 ENCOUNTER — Ambulatory Visit: Payer: Medicare Other | Attending: Family Medicine | Admitting: Physical Therapy

## 2020-09-12 DIAGNOSIS — M5431 Sciatica, right side: Secondary | ICD-10-CM | POA: Diagnosis present

## 2020-09-12 DIAGNOSIS — R252 Cramp and spasm: Secondary | ICD-10-CM | POA: Insufficient documentation

## 2020-09-12 NOTE — Therapy (Signed)
[facility] [address] [place], [place], [address] Phone: [phone]   Fax:  [phone]  Physical Therapy Evaluation  Patient Details  Name: Jon Terry MRN: [id number] Date of Birth: 23-Apr-1951 Referring Provider (PT): Darrow Bussing, MD   Encounter Date: 09/12/2020   PT End of Session - 09/12/20 1016    Visit Number 1    Date for PT Re-Evaluation 11/07/20    Authorization Type [employer] Medicare    Progress Note Due on Visit 10    PT Start Time 1016    PT Stop Time 1057    PT Time Calculation (min) 41 min    Activity Tolerance Patient tolerated treatment well    Behavior During Therapy [facility] for tasks assessed/performed           Past Medical History:  Diagnosis Date  . Anxiety and depression   . Diverticulosis 09/16/2019  . GERD (gastroesophageal reflux disease)    occ  . History of kidney stones 09/16/2019   Partially obstructing 6 x 3 mm calculus within the mid left ureter causing mild left hydronephrosis  . Peyronie disease   . Pre-diabetes   . Sciatica    left and right greater on right  . Seasonal allergies   . Wears glasses     Past Surgical History:  Procedure Laterality Date  . COLONOSCOPY     x2  . CYSTOSCOPY/URETEROSCOPY/HOLMIUM LASER/STENT PLACEMENT Left 09/21/2019   Procedure: CYSTOSCOPY/RETROGRADE/URETEROSCOPY/HOLMIUM LASER/STENT PLACEMENT;  Surgeon: Rene Paci, MD;  Location: [facility];  Service: Urology;  Laterality: Left;  ONLY NEEDS 45 MIN  . HERNIA REPAIR  2010   right inguinal  . WISDOM TOOTH EXTRACTION      There were no vitals filed for this visit.    Subjective Assessment - 09/12/20 1016    Subjective Pt referred to OPPT with Rt sided buttock, posterior thigh and calf pain, tingling in dorsal aspect of foot.  Feels weak.  Started after a bike ride 2 months ago.  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  Has tried 2 regimens of prednisone which helped only a little bit.    Limitations Sitting;Lifting;Walking    How long can you sit comfortably? 30 min    How long can you stand comfortably? constant low grade    How long can you walk comfortably? 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 Yes    Pain Score 8    2/10 in AM, increased throughout the day   Pain Location Buttocks    Pain Orientation Right    Pain Descriptors / Indicators Aching;Tingling;Tightness    Pain Type Acute pain;Chronic pain    Pain Radiating Towards posterior thigh and calf    Pain Onset More than a month ago    Pain Frequency Constant    Aggravating Factors  sitting, standing, walking    Pain Relieving Factors lay on Lt side              [facility] PT Assessment - 09/12/20 0001      Assessment   Medical Diagnosis M54.31 (ICD-10-CM) - Sciatica, right side    Referring Provider (PT) Koirala, Dibas, MD    Onset Date/Surgical Date --  2 mos ago   Next MD Visit as needed    Prior Therapy no      Precautions   Precautions None      Restrictions   Weight Bearing Restrictions No      Balance Screen   Has the patient fallen in the past 6 months No      Home Environment   Living Environment Private residence    Living Arrangements Spouse/significant other    Type of Home House    Home Access Stairs to enter    Entrance Stairs-Number of Steps 5    Home Layout Two level    Alternate Level Stairs-Number of Steps 16      Prior Function   Level of Independence Independent    Vocation Part time employment    Furniture conservator/restorer    Leisure walking, yard work and house work, TEFL teacher   Overall Cognitive Status Within Functional Limits for tasks assessed      Observation/Other Assessments    Observations Pt varies position between sitting and standing during history taking, rubs Rt gluts for relief    Focus on Therapeutic Outcomes (FOTO)  65% limited, goal 43%      Sensation   Light Touch Appears Intact      Functional Tests   Functional tests Single leg stance      Single Leg Stance   Comments able to balance on Rt/Lt LE      Posture/Postural Control   Posture/Postural Control Postural limitations    Postural Limitations Rounded Shoulders;Forward head;Decreased lumbar lordosis;Increased thoracic kyphosis      ROM / Strength   AROM / PROM / Strength AROM;PROM;Strength      AROM   Overall AROM Comments prone lying relieved Rt LE symptoms, prone press up on elbows gave relief, did not tolerate prone press up on hands due to pain    AROM Assessment Site Lumbar    Lumbar Flexion 25 deg, pain    Lumbar Extension 5 deg, pain    Lumbar - Right Side Bend 10 deg, pain    Lumbar - Left Side Bend 10 deg, no pain    Lumbar - Right Rotation limited 25%    Lumbar - Left Rotation limited 25%      PROM   Overall PROM Comments limited hip IR bil, otherwise hips WNL      Strength   Overall Strength Comments 5/5 bil LEs, atrophy noted Rt>Lt lumbar multifidi L3-L5, no atrophy observed in LEs      Flexibility   Soft Tissue Assessment /Muscle Length yes    Hamstrings limited on Lt at end range, limited on Rt by neural tension pain, 30%      Palpation   Palpation comment trigger points present and hypertonus: Lt glut max, Lt glut med, medial hamstring      Special Tests    Special Tests Lumbar    Lumbar Tests Straight Leg Raise      Straight Leg Raise   Findings Positive    Side  Right    Comment at 45 deg      Ambulation/Gait   Gait Comments decreased WB through Rt LE, lateral trunk lean to Lt                      Objective measurements completed on examination: See above findings.       [facility] Adult PT Treatment/Exercise - 09/12/20  0001      Self-Care    Self-Care Other Self-Care Comments    Other Self-Care Comments  DN info, limit sitting, use towel roll when must sit in car and teaching guitar lessons      Exercises   Exercises Lumbar      Lumbar Exercises: Prone   Other Prone Lumbar Exercises prone lying x 3 min, prone press up on forearms 3x10 sec    Other Prone Lumbar Exercises one trial of press up onto palms, strain in lumbar region noted                    PT Short Term Goals - 09/12/20 1122      PT SHORT TERM GOAL #1   Title Pt will report use of positioning to manage Rt LE pain throughout the day so pain does not exceed 5/10.    Time 3    Period Weeks    Status New    Target Date 10/03/20      PT SHORT TERM GOAL #2   Title Pt will be able to return to 10 min walks with pain not to exceed 3/10.    Time 4    Period Weeks    Status New    Target Date 10/10/20      PT SHORT TERM GOAL #3   Title Pt will be able to tolerate sitting x 30 min with pain not to exceed 3/10.    Time 4    Period Weeks    Status New    Target Date 10/10/20      PT SHORT TERM GOAL #4   Title Pt will be able to demo proper form for bending/lifting to return to light housework without exacerbation of pain or risk of re-injury.    Time 4    Period Weeks    Status New    Target Date 10/10/20      PT SHORT TERM GOAL #5   Title Reduce FOTO to </= 50%    Baseline 65%, goal 43%    Time 4    Period Weeks    Status New    Target Date 10/17/20             PT Long Term Goals - 09/12/20 1127      PT LONG TERM GOAL #1   Title Pt will be able to walk up to 3 times a week x 25 min with pain not to exceed 2/10.    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    Time 8    Period Weeks    Status New    Target Date 11/07/20      PT  LONG TERM GOAL #3   Title Pt will be able to perform yardwork such as mowing with pain not to exceed 2/10.      PT LONG TERM GOAL #4   Title Pt will demo trunk ROM of at least flexion to 50 deg, ext to 10 deg and bil SB of 10 deg without pain to improve overall mobility for functional tasks around the home.    Time 8    Period Weeks  Status New    Target Date 11/07/20      PT LONG TERM GOAL #5   Title FOTO score </= 43% to demo less limitation.    Baseline 65%    Time 8    Period Weeks    Status New    Target Date 11/07/20                  Plan - 09/12/20 1112    Clinical Impression Statement Pt is a pleasant 69yo male with onset of acute Rt sided buttock, posterior thigh and calf pain 2 months ago following a long bike ride.  He has a history of a diagnosed lumbar disc herniation which was previously asymptomatic. No new imaging studies have been done.  Pain does not limit sleep (sleeps on Lt side for relief) and is 2/10 on waking.  Pain reaches 8/10 throughout day with aggravting factors of sitting > standing/walking.  He has intermittent tingling in dorsal aspect of Rt foot and some intermittent sharp groin pain into Rt groin/testicle.  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  Pt presents with forward head, rounded t-spine and shoulders and reduced lumbar lordosis.  He has + Rt LE pain with limited trunk ROM into flexion, Rt SB > extension.  No pain with Lt SB.  He has + Rt SLR at 45 deg.  He has atrophy present in Rt>Lt lumbar multifidi but strength in bil LEs is 5/5.  He has trigger points in Rt buttock and hamstring.  Pt reported resolution of Rt LE pain in prone lying and with press ups onto forearms.  PT gave this for HEP, asked him to limit sitting as much as possible and use towel roll in lumbar spine when he must sit, and gave info about DN.  Pt will benefit from skilled PT to reduce pain and return to active lifestyle.     Personal Factors and Comorbidities Time since onset of injury/illness/exacerbation;Profession;Age    Examination-Activity Limitations Lift;Bend;Sit;Stairs;Stand;Squat    Examination-Participation Restrictions Cleaning;Meal Prep;Occupation;Yard Work;Laundry;Driving;Community Activity    Stability/Clinical Decision Making Stable/Uncomplicated    Clinical Decision Making Low    Rehab Potential Excellent    PT Frequency 2x / week    PT Duration 8 weeks    PT Treatment/Interventions ADLs/Self Care Home Management;Cryotherapy;Electrical Stimulation;Moist Heat;Traction;Neuromuscular re-education;Functional mobility training;Therapeutic activities;Therapeutic exercise;Manual techniques;Patient/family education;Spinal Manipulations;Joint Manipulations;Dry needling;Passive range of motion    PT Next Visit Plan DN Rt gluts and medial hamstring, initiate lumbar multifidus activations prone    PT Home Exercise Plan Access Code: [email or web address] (prone lying, prone on elbows), DN info, avoid sitting or use a lumbar roll when must sit    Consulted and Agree with Plan of Care Patient           Patient will benefit from skilled therapeutic intervention in order to improve the following deficits and impairments:  Postural dysfunction, Abnormal gait, Decreased range of motion, Increased muscle spasms, Decreased activity tolerance, Pain, Impaired flexibility, Hypomobility, Decreased mobility  Visit Diagnosis: Sciatica, right side - Plan: PT plan of care cert/re-cert  Cramp and spasm - Plan: PT plan of care cert/re-cert     Problem List There are no problems to display for this patient.   Loistine Simas Brendalyn Vallely, PT 09/12/20 11:34 AM   [facility] [address] [place], [place], [address] Phone: [phone]   Fax:  [phone]  Name: DAELEN BELVEDERE MRN: [id number]  Date of Birth: [birth year]

## 2020-09-12 NOTE — Patient Instructions (Signed)

## 2020-09-17 ENCOUNTER — Other Ambulatory Visit: Payer: Self-pay

## 2020-09-17 ENCOUNTER — Encounter: Payer: Self-pay | Admitting: Physical Therapy

## 2020-09-17 ENCOUNTER — Ambulatory Visit: Payer: Medicare Other | Admitting: Physical Therapy

## 2020-09-17 DIAGNOSIS — M5431 Sciatica, right side: Secondary | ICD-10-CM | POA: Diagnosis not present

## 2020-09-17 DIAGNOSIS — R252 Cramp and spasm: Secondary | ICD-10-CM

## 2020-09-17 NOTE — Patient Instructions (Signed)
Access Code: [email or web address] URL: [email or web address] Date: 09/17/2020 Prepared by: Loistine Simas Kriti Katayama  Exercises Lying Prone - 2-3 x daily - 7 x weekly - 1 sets - 1 reps - 3-5 min hold Prone Press Up on Elbows - 2-3 x daily - 7 x weekly - 1 sets - 10 reps - 20-30 sec hold Cat-Camel - 1 x daily - 7 x weekly - 2 sets - 10 reps Sidelying Transversus Abdominis Bracing - 1 x daily - 7 x weekly - 1 sets - 10 reps - 5 hold

## 2020-09-17 NOTE — Therapy (Signed)
[facility] [address] [place], [place], [address] Phone: [phone]   Fax:  [phone]  Physical Therapy Treatment  Patient Details  Name: Jon HUSER MRN: [id number] Date of Birth: 01-Feb-1951 Referring Provider (PT): Darrow Bussing, MD   Encounter Date: 09/17/2020   PT End of Session - 09/17/20 1110    Visit Number 2    Date for PT Re-Evaluation 11/07/20    Authorization Type U[employer] Medicare    Progress Note Due on Visit 10    PT Start Time 1016    PT Stop Time 1101    PT Time Calculation (min) 45 min    Activity Tolerance Patient tolerated treatment well    Behavior During Therapy [facility] for tasks assessed/performed           Past Medical History:  Diagnosis Date  . Anxiety and depression   . Diverticulosis 09/16/2019  . GERD (gastroesophageal reflux disease)    occ  . History of kidney stones 09/16/2019   Partially obstructing 6 x 3 mm calculus within the mid left ureter causing mild left hydronephrosis  . Peyronie disease   . Pre-diabetes   . Sciatica    left and right greater on right  . Seasonal allergies   . Wears glasses     Past Surgical History:  Procedure Laterality Date  . COLONOSCOPY     x2  . CYSTOSCOPY/URETEROSCOPY/HOLMIUM LASER/STENT PLACEMENT Left 09/21/2019   Procedure: CYSTOSCOPY/RETROGRADE/URETEROSCOPY/HOLMIUM LASER/STENT PLACEMENT;  Surgeon: Rene Paci, MD;  Location: [facility];  Service: Urology;  Laterality: Left;  ONLY NEEDS 45 MIN  . HERNIA REPAIR  2010   right inguinal  . WISDOM TOOTH EXTRACTION      There were no vitals filed for this visit.   Subjective Assessment - 09/17/20 1020    Subjective I am so much better. I felt something shift during my prone stretching and pain is significantly reduced.  Sitting is still the aggravating factor.  Pain remains in hip and posterior thigh.    Limitations Sitting;Lifting;Walking    How long  can you sit comfortably? 30 min    How long can you stand comfortably? constant low grade    How long can you walk comfortably? 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 Yes    Pain Score 2     Pain Location Buttocks    Pain Orientation Right    Pain Descriptors / Indicators Aching    Pain Radiating Towards posterior thigh, no more groin or calf pain    Pain Onset More than a month ago    Aggravating Factors  sitting    Pain Relieving Factors lay on Lt side                             [facility] Adult PT Treatment/Exercise - 09/17/20 0001      Self-Care   Self-Care Other Self-Care Comments    Other Self-Care Comments  tennis ball in sock for Rt lateral hip TP release in SL as tolerated, PT observed and gave cues for donning lumbar brace Pt brought today, he wasn't aware of an outer layer of velcro support, PT cued Pt to use abdominals within lumbar  brace      Neuro Re-ed    Neuro Re-ed Details  sidelying TrA 10x5 sec holds   PT cued don't overcontract     Lumbar Exercises: Supine   Other Supine Lumbar Exercises trial of Rt sciatic nerve glides oscillating at knee, PT d/c'd due to Pt noting return of LBP with this      Lumbar Exercises: Sidelying   Other Sidelying Lumbar Exercises Lt SL Rt LE small range trial of Rt sciatic glides, Pt noted hint of return of LBP so PT d/c'd for now      Lumbar Exercises: Quadruped   Madcat/Old Horse 5 reps    Madcat/Old Horse Limitations no rounding, neutral spine to extension only, TrA cued in neutral      Manual Therapy   Manual Therapy Soft tissue mobilization;Other (comment)    Manual therapy comments Pt declined DN today, wanted to try STM instead    Soft tissue mobilization in Lt SL: Rt glut med and piriformis strumming and stripping, TP ischemic pressure 2x30  sec each muscle    Other Manual Therapy contract/relax Rt hip ER with TP pressure on TP in Lt SL 5x5 sec holds                  PT Education - 09/17/20 1108    Education Details Access Code: [email or web address] - added quadruped tabletop to extension (no rounding) and SL TrA contractions    Person(s) Educated Patient    Methods Explanation;Demonstration;Handout;Verbal cues    Comprehension Returned demonstration;Verbalized understanding            PT Short Term Goals - 09/17/20 1116      PT SHORT TERM GOAL #1   Title Pt will report use of positioning to manage Rt LE pain throughout the day so pain does not exceed 5/10.    Status Achieved             PT Long Term Goals - 09/12/20 1127      PT LONG TERM GOAL #1   Title Pt will be able to walk up to 3 times a week x 25 min with pain not to exceed 2/10.    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    Time 8    Period Weeks    Status New    Target Date 11/07/20      PT LONG TERM GOAL #3   Title Pt will be able to perform yardwork such as mowing with pain not to exceed 2/10.      PT LONG TERM GOAL #4   Title Pt will demo trunk ROM of at least flexion to 50 deg, ext to 10 deg and bil SB of 10 deg without pain to improve overall mobility for functional tasks around the home.    Time 8    Period Weeks    Status New    Target Date 11/07/20      PT LONG TERM GOAL #5   Title FOTO score </= 43% to demo less limitation.    Baseline 65%    Time 8    Period Weeks    Status New    Target Date 11/07/20  Plan - 09/17/20 1110    Clinical Impression Statement Pt with signif relief and reduction of pain since evaluation.  He felt "something shift" during prone press ups.  He no longer has groin/testicle pain, LBP or  pain below the knee.  He has 2/10 pain in Rt buttock and intermittent Rt posterior thigh pain.  PT noted TPs in glut med and piriformis but hamstring was non-tender today.  Pt declined DN for Rt hip so STM and manual techniques were used with good response.  PT introduced core and gentle quadruped mobility, avoiding lumbar flexion for now.  PT trialed neural glides but increased LBP with this both in supine and SL do PT d/c'd for now.  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  PT suggested tennis ball TP release to Rt lateral hip as tol given hip stretching has barrier of neural tension pain for now.  Continue along POC with ongoing assessment of readiness for more dynamic stabilization.    Rehab Potential Excellent    PT Frequency 2x / week    PT Duration 8 weeks    PT Treatment/Interventions ADLs/Self Care Home Management;Cryotherapy;Electrical Stimulation;Moist Heat;Traction;Neuromuscular re-education;Functional mobility training;Therapeutic activities;Therapeutic exercise;Manual techniques;Patient/family education;Spinal Manipulations;Joint Manipulations;Dry needling;Passive range of motion    PT Next Visit Plan continue prone press ups, Rt lateral and posterior hip STM, trial proximal hamstring STM on Rt, progress gentle stabilization in quadruped (try bird dog) and standing    PT Home Exercise Plan Access Code: [email or web address], avoid/limit sitting, towel roll with sitting    Recommended Other Services Pt has declined DN for now, hopes other techniques will work instead, may be open to it if progress plateaus    Consulted and Agree with Plan of Care Patient           Patient will benefit from skilled therapeutic intervention in order to improve the following deficits and impairments:     Visit Diagnosis: Sciatica, right side  Cramp and spasm     Problem List There are no problems to display for this patient.   Loistine Simas Gabi Mcfate, PT 09/17/20 11:18  AM   [facility] [address] [place], [place], [address] Phone: [phone]   Fax:  [phone]  Name: BARUC TUGWELL MRN: [id number] Date of Birth: 07-12-1951

## 2020-10-03 ENCOUNTER — Encounter: Payer: Medicare Other | Admitting: Physical Therapy

## 2020-10-16 ENCOUNTER — Other Ambulatory Visit: Payer: Self-pay

## 2020-10-16 ENCOUNTER — Ambulatory Visit: Payer: Medicare Other | Admitting: Physical Therapy

## 2020-10-25 ENCOUNTER — Other Ambulatory Visit: Payer: Self-pay

## 2020-10-25 ENCOUNTER — Ambulatory Visit: Payer: Medicare Other | Attending: Family Medicine | Admitting: Physical Therapy

## 2020-10-25 DIAGNOSIS — M5431 Sciatica, right side: Secondary | ICD-10-CM | POA: Diagnosis not present

## 2020-10-25 DIAGNOSIS — R252 Cramp and spasm: Secondary | ICD-10-CM | POA: Diagnosis present

## 2020-10-25 NOTE — Patient Instructions (Signed)
Access Code: [email or web address] URL: [email or web address] Date: 10/25/2020 Prepared by: Lavinia Sharps  Exercises Lying Prone - 2-3 x daily - 7 x weekly - 1 sets - 1 reps - 3-5 min hold Prone Press Up on Elbows - 2-3 x daily - 7 x weekly - 1 sets - 10 reps - 20-30 sec hold Cat-Camel - 1 x daily - 7 x weekly - 2 sets - 10 reps Sidelying Transversus Abdominis Bracing - 1 x daily - 7 x weekly - 1 sets - 10 reps - 5 hold Seated Slump Nerve Glide - 1 x daily - 7 x weekly - 1 sets - 10 reps Prone Press Up - 2 x daily - 7 x weekly - 1 sets - 10 reps Standing Isometric Hip Abduction with Ball on Wall - 1 x daily - 7 x weekly - 1 sets - 5 reps - 5 hold

## 2020-10-25 NOTE — Therapy (Signed)
[facility] [address], [address] [place], [place], [address] Phone: [phone]   Fax:  [phone]  Physical Therapy Treatment  Patient Details  Name: Jon Terry MRN: [id number] Date of Birth: 05-03-1951 Referring Provider (PT): Darrow Bussing, MD   Encounter Date: 10/25/2020   PT End of Session - 10/25/20 1350    Visit Number 3    Date for PT Re-Evaluation 11/07/20    Authorization Type UHC Medicare    Progress Note Due on Visit 10    PT Start Time 1230    PT Stop Time 1310    PT Time Calculation (min) 40 min    Activity Tolerance Patient tolerated treatment well           Past Medical History:  Diagnosis Date   Anxiety and depression    Diverticulosis 09/16/2019   GERD (gastroesophageal reflux disease)    occ   History of kidney stones 09/16/2019   Partially obstructing 6 x 3 mm calculus within the mid left ureter causing mild left hydronephrosis   Peyronie disease    Pre-diabetes    Sciatica    left and right greater on right   Seasonal allergies    Wears glasses     Past Surgical History:  Procedure Laterality Date   COLONOSCOPY     x2   CYSTOSCOPY/URETEROSCOPY/HOLMIUM LASER/STENT PLACEMENT Left 09/21/2019   Procedure: CYSTOSCOPY/RETROGRADE/URETEROSCOPY/HOLMIUM LASER/STENT PLACEMENT;  Surgeon: Rene Paci, MD;  Location: [facility];  Service: Urology;  Laterality: Left;  ONLY NEEDS 45 MIN   HERNIA REPAIR  2010   right inguinal   WISDOM TOOTH EXTRACTION      There were no vitals filed for this visit.   Subjective Assessment - 10/25/20 1228    Subjective I'm doing better.  I drove to [place] and back.  My back was sore went I got back.  Almost no pain right now.  Press ups helped.  That leg pain held on for a while.  Now if I sit too long I would feel it right low back and posterior thigh (20 minutes).  If I have a sudden pain I can feel it in my groin.     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 Yes    Pain Score 2               [facility] PT Assessment - 10/25/20 0001      AROM   Lumbar Flexion 50   finger tips to mid shin no pain   Lumbar Extension 15   no pain     Strength   Overall Strength Comments right hip abductor weakness 5-/5      Slump test   Findings Negative      Prone Knee Bend Test   Findings Negative                         [facility] Adult PT Treatment/Exercise - 10/25/20 0001      Lumbar Exercises: Stretches   Press Ups 5 reps    Press Ups Limitations encouraged hands under shoulder placement and head forward       Lumbar Exercises: Standing   Other Standing Lumbar Exercises hip abduction wall isometric 5 sec hold 5x right/left       Lumbar Exercises: Seated   Other  Seated Lumbar Exercises neural floss 10x right/left     Other Seated Lumbar Exercises review of current HEP      Lumbar Exercises: Prone   Other Prone Lumbar Exercises right hip extension with left UE extension 5x                     PT Short Term Goals - 09/17/20 1116      PT SHORT TERM GOAL #1   Title Pt will report use of positioning to manage Rt LE pain throughout the day so pain does not exceed 5/10.    Status Achieved             PT Long Term Goals - 09/12/20 1127      PT LONG TERM GOAL #1   Title Pt will be able to walk up to 3 times a week x 25 min with pain not to exceed 2/10.    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    Time 8    Period Weeks    Status New    Target Date 11/07/20      PT LONG TERM GOAL #3   Title Pt will be able to perform yardwork such as mowing with pain not to exceed 2/10.      PT  LONG TERM GOAL #4   Title Pt will demo trunk ROM of at least flexion to 50 deg, ext to 10 deg and bil SB of 10 deg without pain to improve overall mobility for functional tasks around the home.    Time 8    Period Weeks    Status New    Target Date 11/07/20      PT LONG TERM GOAL #5   Title FOTO score </= 43% to demo less limitation.    Baseline 65%    Time 8    Period Weeks    Status New    Target Date 11/07/20                 Plan - 10/25/20 1241    Clinical Impression Statement The patient returns to PT after a gap in care secondary to an unexpected trip out of town and illness.  He reports he is feeling better overall with decreased symptom intensity and frequency.  Occassional right groin pain with getting out of bed from the supine position but not present with getting out of bed from the sidelying position.  Improved lumbar ROM in all planes without pain although he lacks full extension with prone press ups.  No neural symptoms however given the presence of LE symptoms for 3 months  added neural flossing to HEP.  Therapist monitoring response throughout treatment session.    Personal Factors and Comorbidities Time since onset of injury/illness/exacerbation;Profession;Age    Examination-Activity Limitations Lift;Bend;Sit;Stairs;Stand;Squat    Examination-Participation Restrictions Cleaning;Meal Prep;Occupation;Yard Work;Laundry;Driving;Community Activity    Rehab Potential Excellent    PT Frequency 2x / week    PT Duration 8 weeks    PT Treatment/Interventions ADLs/Self Care Home Management;Cryotherapy;Electrical Stimulation;Moist Heat;Traction;Neuromuscular re-education;Functional mobility training;Therapeutic activities;Therapeutic exercise;Manual techniques;Patient/family education;Spinal Manipulations;Joint Manipulations;Dry needling;Passive range of motion    PT Next Visit Plan recheck prone press ups  with arms under shoulders to achieve endrange,  try  Rt lateral and  posterior hip STM, trial proximal hamstring STM on Rt, progress gentle stabilization in quadruped (try bird dog) and standing    PT Home Exercise Plan Access Code: [email or web address], avoid/limit sitting, towel roll with sitting           Patient will benefit from skilled therapeutic intervention in order to improve the following deficits and impairments:  Postural dysfunction, Abnormal gait, Decreased range of motion, Increased muscle spasms, Decreased activity tolerance, Pain, Impaired flexibility, Hypomobility, Decreased mobility  Visit Diagnosis: Sciatica, right side  Cramp and spasm     Problem List There are no problems to display for this patient.  Lavinia Sharps, PT 10/25/20 6:54 PM Phone: [phone] Fax: [phone] Vivien Presto 10/25/2020, 6:53 PM  [facility] [address], [address] [place], [place], [address] Phone: [phone]   Fax:  [phone]  Name: GREGORY BARRICK MRN: [id number] Date of Birth: 1951/01/08

## 2020-10-29 ENCOUNTER — Encounter: Payer: Self-pay | Admitting: Physical Therapy

## 2020-10-29 ENCOUNTER — Other Ambulatory Visit: Payer: Self-pay

## 2020-10-29 ENCOUNTER — Ambulatory Visit: Payer: Medicare Other | Attending: Family Medicine | Admitting: Physical Therapy

## 2020-10-29 DIAGNOSIS — R252 Cramp and spasm: Secondary | ICD-10-CM

## 2020-10-29 DIAGNOSIS — M5431 Sciatica, right side: Secondary | ICD-10-CM | POA: Insufficient documentation

## 2020-10-29 NOTE — Therapy (Signed)
[facility] [address], [address] [place], [place], [address] Phone: [phone]   Fax:  [phone]  Physical Therapy Treatment  Patient Details  Name: Jon Terry MRN: [id number] Date of Birth: 01/29/1951 Referring Provider (PT): Darrow Bussing, MD   Encounter Date: 10/29/2020   PT End of Session - 10/29/20 0931    Visit Number 4    Date for PT Re-Evaluation 11/07/20    Authorization Type UHC Medicare    Progress Note Due on Visit 10    PT Start Time 0931    PT Stop Time 1018    PT Time Calculation (min) 47 min    Activity Tolerance Patient tolerated treatment well    Behavior During Therapy [facility] for tasks assessed/performed           Past Medical History:  Diagnosis Date  . Anxiety and depression   . Diverticulosis 09/16/2019  . GERD (gastroesophageal reflux disease)    occ  . History of kidney stones 09/16/2019   Partially obstructing 6 x 3 mm calculus within the mid left ureter causing mild left hydronephrosis  . Peyronie disease   . Pre-diabetes   . Sciatica    left and right greater on right  . Seasonal allergies   . Wears glasses     Past Surgical History:  Procedure Laterality Date  . COLONOSCOPY     x2  . CYSTOSCOPY/URETEROSCOPY/HOLMIUM LASER/STENT PLACEMENT Left 09/21/2019   Procedure: CYSTOSCOPY/RETROGRADE/URETEROSCOPY/HOLMIUM LASER/STENT PLACEMENT;  Surgeon: Rene Paci, MD;  Location: [facility];  Service: Urology;  Laterality: Left;  ONLY NEEDS 45 MIN  . HERNIA REPAIR  2010   right inguinal  . WISDOM TOOTH EXTRACTION      There were no vitals filed for this visit.   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  Signif less pain since starting PT.  Down to 5% pain.  I am walking again.  I get occassional pain below testicles on Rt side with bending and in posterior thigh on Rt with sitting x 30 min.     Limitations Sitting;Lifting;Walking    How long can you sit comfortably? 30 min    How long can you stand comfortably? constant low grade    How long can you walk comfortably? 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    Pain Score 0-No pain                             [facility] Adult PT Treatment/Exercise - 10/29/20 0001      Neuro Re-ed    Neuro Re-ed Details  standing alignment posture cued throughout standing ther ex with bands      Exercises   Exercises Shoulder      Lumbar Exercises: Stretches   Active Hamstring Stretch Right;30 seconds    Active Hamstring Stretch Limitations PT performed manual tack and stretch along length of hamstring with oscillating knee ext, then prolonged hold of active stretch x 30 sec (more comfortable after tack and stretch)    Other Lumbar Stretch Exercise warrior stretch with bil UEs overhead and SB bil 1x30 sec      Lumbar Exercises: Standing  Other Standing Lumbar Exercises trunk SB A/ROM x 10 reps after spine mobs    Other Standing Lumbar Exercises standing ext x 5 reps with Pt using thumbs as anchor over L5 bil      Lumbar Exercises: Prone   Other Prone Lumbar Exercises single arm Rt UE prone press up for targeted Rt lumbar ext 5x5 sec holds      Shoulder Exercises: Standing   Flexion Strengthening;Both;10 reps;Theraband    Theraband Level (Shoulder Flexion) Level 3 (Green)    Flexion Limitations PT cued hands to hips with postural alignment to correct anterior sway pelvis, forward head, flared ribcage    Extension Strengthening;Theraband;Both;10 reps    Theraband Level (Shoulder Extension) Level 3 (Green)    Extension Limitations PT cued hands to hips with postural alignment to correct anterior sway pelvis, forward head, flared ribcage      Manual Therapy   Manual Therapy Joint  mobilization    Joint Mobilization prone PAs and UPAs L5/S1 and Rt/Lt facets L5/S1                    PT Short Term Goals - 09/17/20 1116      PT SHORT TERM GOAL #1   Title Pt will report use of positioning to manage Rt LE pain throughout the day so pain does not exceed 5/10.    Status Achieved             PT Long Term Goals - 09/12/20 1127      PT LONG TERM GOAL #1   Title Pt will be able to walk up to 3 times a week x 25 min with pain not to exceed 2/10.    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    Time 8    Period Weeks    Status New    Target Date 11/07/20      PT LONG TERM GOAL #3   Title Pt will be able to perform yardwork such as mowing with pain not to exceed 2/10.      PT LONG TERM GOAL #4   Title Pt will demo trunk ROM of at least flexion to 50 deg, ext to 10 deg and bil SB of 10 deg without pain to improve overall mobility for functional tasks around the home.    Time 8    Period Weeks    Status New    Target Date 11/07/20      PT LONG TERM GOAL #5   Title FOTO score </= 43% to demo less limitation.    Baseline 65%    Time 8    Period Weeks    Status New    Target Date 11/07/20                 Plan - 10/29/20 1253    Clinical Impression Statement Pt reports 5% residual pain left from start of PT.  He can now sit x 30 min with slight exacerbation of Rt posterior thigh pain.  He rarely experiences fleeting Rt groin pain behind testicle that does not last and occurs with hip flexion or abduction functional use.  PT targeted manual techniques and Rt single  arm prone press ups to improve segmental motion of Rt L5/S1 facet which yielded improved Rt trunk SB and extension A/ROM.  Tack and stretch in supine was used along Rt hamstring which allowed  for more ease/less pain with hamstring stretch today.  PT added trial of warrior stretch bil and introduced standing postural alignment cues with green band UE pullbacks/forwards for core awareness in good posture.  Pt making excellent progress in pain reduction but will continue to benefit from postural strenth and flexibility to avoid future exacerbation of pain and optimize function.    Rehab Potential Excellent    PT Frequency 2x / week    PT Duration 8 weeks    PT Treatment/Interventions ADLs/Self Care Home Management;Cryotherapy;Electrical Stimulation;Moist Heat;Traction;Neuromuscular re-education;Functional mobility training;Therapeutic activities;Therapeutic exercise;Manual techniques;Patient/family education;Spinal Manipulations;Joint Manipulations;Dry needling;Passive range of motion    PT Next Visit Plan review LTGs and ask if Pt will want extension at re-eval (11/8), revisit standing posture with UE bands, body mechanics for squat using hip hinge, try dead lifts and box pick ups from elevated table    PT Home Exercise Plan Access Code: [email or web address], avoid/limit sitting, towel roll with sitting    Consulted and Agree with Plan of Care Patient           Patient will benefit from skilled therapeutic intervention in order to improve the following deficits and impairments:     Visit Diagnosis: Sciatica, right side  Cramp and spasm     Problem List There are no problems to display for this patient.   Loistine Simas Yandell Mcjunkins, PT 10/29/20 1:06 PM   [facility] [address], [address] [place], [place], [address] Phone: [phone]   Fax:  [phone]  Name: AFTON MIKELSON MRN: [id number] Date of Birth: 1951/10/18

## 2020-11-01 ENCOUNTER — Ambulatory Visit: Payer: Medicare Other | Admitting: Physical Therapy

## 2020-11-01 ENCOUNTER — Other Ambulatory Visit: Payer: Self-pay

## 2020-11-01 DIAGNOSIS — M5431 Sciatica, right side: Secondary | ICD-10-CM

## 2020-11-01 DIAGNOSIS — R252 Cramp and spasm: Secondary | ICD-10-CM

## 2020-11-01 NOTE — Therapy (Signed)
[facility] [address] [place], [place], [address] Phone: [phone]   Fax:  [phone]  Physical Therapy Treatment  Patient Details  Name: Jon Terry MRN: [id number] Date of Birth: [birth year] Referring Provider (PT): Darrow Bussing, MD   Encounter Date: 11/01/2020   PT End of Session - 11/01/20 1153    Visit Number 5    Date for PT Re-Evaluation 11/07/20    Authorization Type [employer] Medicare    Progress Note Due on Visit 10    PT Start Time 1103    PT Stop Time 1145    PT Time Calculation (min) 42 min    Activity Tolerance Patient tolerated treatment well           Past Medical History:  Diagnosis Date  . Anxiety and depression   . Diverticulosis 09/16/2019  . GERD (gastroesophageal reflux disease)    occ  . History of kidney stones 09/16/2019   Partially obstructing 6 x 3 mm calculus within the mid left ureter causing mild left hydronephrosis  . Peyronie disease   . Pre-diabetes   . Sciatica    left and right greater on right  . Seasonal allergies   . Wears glasses     Past Surgical History:  Procedure Laterality Date  . COLONOSCOPY     x2  . CYSTOSCOPY/URETEROSCOPY/HOLMIUM LASER/STENT PLACEMENT Left 09/21/2019   Procedure: CYSTOSCOPY/RETROGRADE/URETEROSCOPY/HOLMIUM LASER/STENT PLACEMENT;  Surgeon: Rene Paci, MD;  Location: [facility];  Service: Urology;  Laterality: Left;  ONLY NEEDS 45 MIN  . HERNIA REPAIR  2010   right inguinal  . WISDOM TOOTH EXTRACTION      There were no vitals filed for this visit.   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  No symptoms right now.  Some twinges around pudendal nerve.  It happens 3-4x a day when I swing leg out to the side to get up from the chair.    How long can you walk comfortably? 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 No/denies    Pain Score 0-No pain    Pain Orientation Right    Pain Onset More than a month ago                             [facility] Adult PT Treatment/Exercise - 11/01/20 0001      Self-Care   Other Self-Care Comments  discussion physiology of healing for nerves; recommend avoiding overstretch of pudendal nerve by avoiding hip abduction/extension with sit to stand and in/out of bed.        Therapeutic Activites    Therapeutic Activities ADL's;Other Therapeutic Activities    ADL's hip hinge with golf club 2x 8    Other Therapeutic Activities 10# modified dead lift 10x       Shoulder Exercises: Standing   Extension Strengthening;Theraband;Both;10 reps    Theraband Level (Shoulder Extension) Level 3 (Green)   over the top of the door with coordinated breathing    Other Standing Exercises single arm green band extension with opposite LE extension 10x right/left     Other Standing Exercises facing away from door bil green band extension in kickstand  position 2x10                  PT Education - 11/01/20 1153    Education Details variations of green band ex's;  hip hinge with golf club and modified dead lift    Person(s) Educated Patient    Methods Explanation;Demonstration;Handout    Comprehension Returned demonstration;Verbalized understanding            PT Short Term Goals - 11/01/20 1200      PT SHORT TERM GOAL #1   Title Pt will report use of positioning to manage Rt LE pain throughout the day so pain does not exceed 5/10.    Status Achieved      PT SHORT TERM GOAL #2   Title Pt will be able to return to 10 min walks with pain not to exceed 3/10.    Status Achieved      PT SHORT TERM GOAL #3   Title Pt will be able to tolerate sitting x 30 min with pain not to exceed 3/10.    Status Achieved      PT SHORT TERM GOAL #4   Title Pt will be able to  demo proper form for bending/lifting to return to light housework without exacerbation of pain or risk of re-injury.    Status Achieved      PT SHORT TERM GOAL #5   Title Reduce FOTO to </= 50%    Status On-going             PT Long Term Goals - 11/01/20 1201      PT LONG TERM GOAL #1   Title Pt will be able to walk up to 3 times a week x 25 min with pain not to exceed 2/10.    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    Time 8    Period Weeks    Status On-going      PT LONG TERM GOAL #3   Title Pt will be able to perform yardwork such as mowing with pain not to exceed 2/10.    Status On-going      PT LONG TERM GOAL #4   Title Pt will demo trunk ROM of at least flexion to 50 deg, ext to 10 deg and bil SB of 10 deg without pain to improve overall mobility for functional tasks around the home.    Time 8    Period Weeks    Status On-going      PT LONG TERM GOAL #5   Title FOTO score </= 43% to demo less limitation.    Time 8    Period Weeks    Status On-going                 Plan - 11/01/20 1154    Clinical Impression Statement The patient has much improved sitting and walking tolerance.  Decreasing anterior hip discomfort with frequency to 3-4x/day now.  He was instructed in hip hinge technique for bending/lifting.  He has difficulty keeping his head in neutral position and needs verbal cues to move hips back instead of squatting straight down.  Therapist closely monitoring response throughout treatment session.    Personal Factors and Comorbidities Time  since onset of injury/illness/exacerbation;Profession;Age    Examination-Participation Restrictions Cleaning;Meal Prep;Occupation;Yard Work;Laundry;Driving;Community Activity    Rehab Potential Excellent    PT Frequency 2x / week    PT  Duration 8 weeks    PT Treatment/Interventions ADLs/Self Care Home Management;Cryotherapy;Electrical Stimulation;Moist Heat;Traction;Neuromuscular re-education;Functional mobility training;Therapeutic activities;Therapeutic exercise;Manual techniques;Patient/family education;Spinal Manipulations;Joint Manipulations;Dry needling;Passive range of motion    PT Next Visit Plan assess progress toward goals next visit for possible discharge in 1-3 visits; FOTO; recheck lumbar ROM;  review hip hinge and dead lifts as needed    PT Home Exercise Plan Access Code: [email or web address]           Patient will benefit from skilled therapeutic intervention in order to improve the following deficits and impairments:  Postural dysfunction, Abnormal gait, Decreased range of motion, Increased muscle spasms, Decreased activity tolerance, Pain, Impaired flexibility, Hypomobility, Decreased mobility  Visit Diagnosis: Sciatica, right side  Cramp and spasm     Problem List There are no problems to display for this patient.  Lavinia Sharps, PT 11/01/20 12:04 PM Phone: [phone] Fax: [phone] Vivien Presto 11/01/2020, 12:04 PM  [facility] [address] [place], [place], [address] Phone: [phone]   Fax:  [phone]  Name: Jon Terry MRN: [id number] Date of Birth: 10-Sep-1951

## 2020-11-01 NOTE — Patient Instructions (Signed)
Access Code: [email or web address] URL: [email or web address] Date: 11/01/2020 Prepared by: Lavinia Sharps  Exercises Lying Prone - 2-3 x daily - 7 x weekly - 1 sets - 1 reps - 3-5 min hold Prone Press Up on Elbows - 2-3 x daily - 7 x weekly - 1 sets - 10 reps - 20-30 sec hold Cat-Camel - 1 x daily - 7 x weekly - 2 sets - 10 reps Sidelying Transversus Abdominis Bracing - 1 x daily - 7 x weekly - 1 sets - 10 reps - 5 hold Seated Slump Nerve Glide - 1 x daily - 7 x weekly - 1 sets - 10 reps Prone Press Up - 2 x daily - 7 x weekly - 1 sets - 10 reps Standing Isometric Hip Abduction with Ball on Wall - 1 x daily - 7 x weekly - 1 sets - 5 reps - 5 hold Shoulder Extension with Resistance - 1 x daily - 7 x weekly - 1 sets - 10 reps Standing Hip Hinge with Dowel - 1 x daily - 7 x weekly - 1 sets - 10 reps Half Dead Lift with Kettlebell - 1 x daily - 7 x weekly - 1 sets - 10 reps

## 2020-11-05 ENCOUNTER — Ambulatory Visit: Payer: Medicare Other | Admitting: Physical Therapy

## 2020-11-05 ENCOUNTER — Other Ambulatory Visit: Payer: Self-pay

## 2020-11-05 ENCOUNTER — Encounter: Payer: Self-pay | Admitting: Physical Therapy

## 2020-11-05 DIAGNOSIS — M5431 Sciatica, right side: Secondary | ICD-10-CM

## 2020-11-05 DIAGNOSIS — R252 Cramp and spasm: Secondary | ICD-10-CM

## 2020-11-05 NOTE — Patient Instructions (Signed)
Acce ss Code: [email or web address] URL: [email or web address] Date: 11/05/2020 Prepared by: Loistine Simas Kloee Ballew  Exercises Lying Prone - 2-3 x daily - 7 x weekly - 1 sets - 1 reps - 3-5 min hold Prone Press Up on Elbows - 2-3 x daily - 7 x weekly - 1 sets - 10 reps - 20-30 sec hold Cat-Camel - 1 x daily - 7 x weekly - 2 sets - 10 reps Sidelying Transversus Abdominis Bracing - 1 x daily - 7 x weekly - 1 sets - 10 reps - 5 hold Seated Slump Nerve Glide - 1 x daily - 7 x weekly - 1 sets - 10 reps Prone Press Up - 2 x daily - 7 x weekly - 1 sets - 10 reps Standing Isometric Hip Abduction with Ball on Wall - 1 x daily - 7 x weekly - 1 sets - 5 reps - 5 hold Shoulder Extension with Resistance - 1 x daily - 7 x weekly - 1 sets - 10 reps Standing Hip Hinge with Dowel - 1 x daily - 7 x weekly - 1 sets - 10 reps Half Dead Lift with Kettlebell - 1 x daily - 7 x weekly - 1 sets - 10 reps Supine Bridge - 1 x daily - 7 x weekly - 2 sets - 10 reps Runner's Climb - 1 x daily - 7 x weekly - 2 sets - 5 reps Standing Hip Extension with Counter Support - 1 x daily - 7 x weekly - 2 sets - 5 reps

## 2020-11-05 NOTE — Therapy (Signed)
[facility] [address], [place], [place], [address] Phone: [phone]   Fax:  [phone]  Physical Therapy Treatment  Patient Details  Name: HARVARD ZEISS MRN: [id number] Date of Birth: 1951/04/07 Referring Provider (PT): Lujean Amel, MD   Encounter Date: 11/05/2020   PT End of Session - 11/05/20 0927    Visit Number 6    Date for PT Re-Evaluation 11/07/20    Authorization Type [employer] Medicare    Progress Note Due on Visit 10    PT Start Time 0930    PT Stop Time 1015    PT Time Calculation (min) 45 min    Activity Tolerance Patient tolerated treatment well    Behavior During Therapy [facility] for tasks assessed/performed           Past Medical History:  Diagnosis Date  . Anxiety and depression   . Diverticulosis 09/16/2019  . GERD (gastroesophageal reflux disease)    occ  . History of kidney stones 09/16/2019   Partially obstructing 6 x 3 mm calculus within the mid left ureter causing mild left hydronephrosis  . Peyronie disease   . Pre-diabetes   . Sciatica    left and right greater on right  . Seasonal allergies   . Wears glasses     Past Surgical History:  Procedure Laterality Date  . COLONOSCOPY     x2  . CYSTOSCOPY/URETEROSCOPY/HOLMIUM LASER/STENT PLACEMENT Left 09/21/2019   Procedure: CYSTOSCOPY/RETROGRADE/URETEROSCOPY/HOLMIUM LASER/STENT PLACEMENT;  Surgeon: Ceasar Mons, MD;  Location: [facility];  Service: Urology;  Laterality: Left;  ONLY NEEDS 45 MIN  . HERNIA REPAIR  2010   right inguinal  . WISDOM TOOTH EXTRACTION      There were no vitals filed for this visit.   Subjective Assessment - 11/05/20 1252    Subjective I sat for rehearsal at the [facility] x 2-3 hours and did fine.  A little soreness the next day but I am feeling close to 100% better.  I still have shots of brief twinges in the pudendal nerve when I swing my leg out of bed.    Limitations  Sitting;Lifting;Walking    How long can you sit comfortably? 2-3 hours    How long can you walk comfortably? 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 No/denies              [facility] PT Assessment - 11/05/20 0001      Observation/Other Assessments   Focus on Therapeutic Outcomes (FOTO)  28%      Posture/Postural Control   Posture Comments improved postural awareness for correction of forward head, rounded shoulders, sternum over pubic bone      AROM   Overall AROM Comments trunk ROM WFL and symmetrical                         [facility] Adult PT Treatment/Exercise - 11/05/20 0001      Self-Care   Self-Care Other Self-Care Comments    Other Self-Care Comments  discussion of the role of PF muscles, pudendal nerve, and symptoms of dysfunciton to watch for (bowel and bladder function, sexual function)      Exercises   Exercises Knee/Hip  Knee/Hip Exercises: Standing   Forward Step Up Both;5 reps;Hand Hold: 2    Other Standing Knee Exercises hip ext x 5 each leg off first step paired with step ups      Knee/Hip Exercises: Supine   Bridges Both;5 reps      Shoulder Exercises: Standing   Flexion Strengthening;Both;Theraband;10 reps;5 reps    Theraband Level (Shoulder Flexion) Level 3 (Green)    Flexion Limitations added hip hinge squat with pull back x 5 after 10 in static good posture    Extension Strengthening;Both;10 reps;5 reps;Theraband    Theraband Level (Shoulder Extension) Level 3 (Green)    Extension Limitations added hip hinge squat with pull back x 5 after 10 in static good posture                  PT Education - 11/05/20 1017    Education Details Acce ss Code: [email or web address]    Person(s) Educated Patient    Methods Handout;Explanation;Demonstration     Comprehension Verbalized understanding;Returned demonstration            PT Short Term Goals - 11/05/20 0927      PT SHORT TERM GOAL #1   Title Pt will report use of positioning to manage Rt LE pain throughout the day so pain does not exceed 5/10.    Status Achieved      PT SHORT TERM GOAL #2   Title Pt will be able to return to 10 min walks with pain not to exceed 3/10.    Status Achieved      PT SHORT TERM GOAL #3   Title Pt will be able to tolerate sitting x 30 min with pain not to exceed 3/10.    Status Achieved      PT SHORT TERM GOAL #4   Title Pt will be able to demo proper form for bending/lifting to return to light housework without exacerbation of pain or risk of re-injury.    Status Achieved      PT SHORT TERM GOAL #5   Title Reduce FOTO to </= 50%    Baseline 28%    Status Achieved             PT Long Term Goals - 11/05/20 0935      PT LONG TERM GOAL #1   Title Pt will be able to walk up to 3 times a week x 25 min with pain not to exceed 2/10.    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    Status Achieved      PT LONG TERM GOAL #3   Title Pt will be able to perform yardwork such as mowing with pain not to exceed 2/10.    Status Achieved      PT LONG TERM GOAL #4   Title Pt will demo trunk ROM of at least flexion to 50 deg, ext to 10 deg and bil SB of 10 deg without pain to improve overall mobility for functional tasks around the home.    Status Achieved      PT LONG TERM GOAL #5   Title FOTO score </= 43% to demo less limitation.    Baseline 28%    Status Achieved  Plan - 11/05/20 1254    Clinical Impression Statement Pt has met all LTGs.  FOTO score reduced from 64% to 28% limited, far suprassing the goal of 43%.  Pt reports nearly 100% resolution of pain.  He was able to  sit for rehearsals x 2-3 hours this weekend without pain.  Trunk A/ROM is symmetrical and WNL.  He has much improved awareness of postural corrections for improved alignment.  PT advanced glut strength and updated HEP to reflect these ideas today.  PT discussed importance of maintaining spinal flexibility to maintain gains made in PT and avoid future flare ups.  PT also discussed the role of the pudendal nerve and pelvic PT and how this may help him should he have any progression of symptoms in the Rt groin along PF muscle function.  Pt is pleased with his progress and is ready for d/c to HEP.    PT Next Visit Plan d/c to HEP    PT Home Exercise Plan Access Code: [email or web address]    Recommended Other Services Pt may benefit from pelvic PT in the future    Consulted and Agree with Plan of Care Patient           Patient will benefit from skilled therapeutic intervention in order to improve the following deficits and impairments:     Visit Diagnosis: Sciatica, right side  Cramp and spasm     Problem List There are no problems to display for this patient.   PHYSICAL THERAPY DISCHARGE SUMMARY  Visits from Start of Care: 6  Current functional level related to goals / functional outcomes: See above   Remaining deficits: See above   Education / Equipment: HEP Plan: Patient agrees to discharge.  Patient goals were met. Patient is being discharged due to meeting the stated rehab goals.  ?????        Baruch Merl, PT 11/05/20 12:59 PM   [facility] [address] [place], [place], [address] Phone: [phone]   Fax:  [phone]  Name: INDY KUCK MRN: [id number] Date of Birth: 05-23-1951

## 2020-11-08 ENCOUNTER — Encounter: Payer: Medicare Other | Admitting: Physical Therapy

## 2020-11-12 ENCOUNTER — Encounter: Payer: Medicare Other | Admitting: Physical Therapy

## 2020-11-15 ENCOUNTER — Encounter: Payer: Medicare Other | Admitting: Physical Therapy

## 2020-11-19 ENCOUNTER — Encounter: Payer: Medicare Other | Admitting: Physical Therapy

## 2020-11-27 ENCOUNTER — Encounter: Payer: Medicare Other | Admitting: Physical Therapy

## 2020-11-29 ENCOUNTER — Encounter: Payer: Medicare Other | Admitting: Physical Therapy

## 2021-04-04 DIAGNOSIS — L03012 Cellulitis of left finger: Secondary | ICD-10-CM | POA: Diagnosis not present

## 2021-05-05 DIAGNOSIS — H6122 Impacted cerumen, left ear: Secondary | ICD-10-CM | POA: Diagnosis not present

## 2021-09-03 DIAGNOSIS — Z23 Encounter for immunization: Secondary | ICD-10-CM | POA: Diagnosis not present

## 2021-10-18 DIAGNOSIS — H35343 Macular cyst, hole, or pseudohole, bilateral: Secondary | ICD-10-CM | POA: Diagnosis not present

## 2021-10-18 DIAGNOSIS — H35373 Puckering of macula, bilateral: Secondary | ICD-10-CM | POA: Diagnosis not present

## 2021-10-18 DIAGNOSIS — H2513 Age-related nuclear cataract, bilateral: Secondary | ICD-10-CM | POA: Diagnosis not present

## 2021-10-25 DIAGNOSIS — H2511 Age-related nuclear cataract, right eye: Secondary | ICD-10-CM | POA: Diagnosis not present

## 2021-10-25 DIAGNOSIS — H2512 Age-related nuclear cataract, left eye: Secondary | ICD-10-CM | POA: Diagnosis not present

## 2021-10-25 DIAGNOSIS — Z01818 Encounter for other preprocedural examination: Secondary | ICD-10-CM | POA: Diagnosis not present

## 2021-12-04 DIAGNOSIS — H2512 Age-related nuclear cataract, left eye: Secondary | ICD-10-CM | POA: Diagnosis not present

## 2022-01-06 DIAGNOSIS — Z87442 Personal history of urinary calculi: Secondary | ICD-10-CM | POA: Diagnosis not present

## 2022-01-06 DIAGNOSIS — N201 Calculus of ureter: Secondary | ICD-10-CM | POA: Diagnosis not present

## 2022-02-14 DIAGNOSIS — H35343 Macular cyst, hole, or pseudohole, bilateral: Secondary | ICD-10-CM | POA: Diagnosis not present

## 2022-02-14 DIAGNOSIS — H35373 Puckering of macula, bilateral: Secondary | ICD-10-CM | POA: Diagnosis not present

## 2022-03-05 DIAGNOSIS — H2511 Age-related nuclear cataract, right eye: Secondary | ICD-10-CM | POA: Diagnosis not present

## 2022-03-28 DIAGNOSIS — Z0000 Encounter for general adult medical examination without abnormal findings: Secondary | ICD-10-CM | POA: Diagnosis not present

## 2022-03-28 DIAGNOSIS — M720 Palmar fascial fibromatosis [Dupuytren]: Secondary | ICD-10-CM | POA: Diagnosis not present

## 2022-03-28 DIAGNOSIS — E7800 Pure hypercholesterolemia, unspecified: Secondary | ICD-10-CM | POA: Diagnosis not present

## 2022-03-28 DIAGNOSIS — Z79899 Other long term (current) drug therapy: Secondary | ICD-10-CM | POA: Diagnosis not present

## 2022-03-28 DIAGNOSIS — R7309 Other abnormal glucose: Secondary | ICD-10-CM | POA: Diagnosis not present

## 2022-04-15 DIAGNOSIS — M720 Palmar fascial fibromatosis [Dupuytren]: Secondary | ICD-10-CM | POA: Diagnosis not present

## 2022-08-05 DIAGNOSIS — H35341 Macular cyst, hole, or pseudohole, right eye: Secondary | ICD-10-CM | POA: Diagnosis not present

## 2022-08-06 DIAGNOSIS — H35341 Macular cyst, hole, or pseudohole, right eye: Secondary | ICD-10-CM | POA: Diagnosis not present

## 2022-08-06 DIAGNOSIS — H35342 Macular cyst, hole, or pseudohole, left eye: Secondary | ICD-10-CM | POA: Diagnosis not present

## 2022-08-06 DIAGNOSIS — Z01818 Encounter for other preprocedural examination: Secondary | ICD-10-CM | POA: Diagnosis not present

## 2022-08-06 DIAGNOSIS — H35373 Puckering of macula, bilateral: Secondary | ICD-10-CM | POA: Diagnosis not present

## 2022-08-19 DIAGNOSIS — H35341 Macular cyst, hole, or pseudohole, right eye: Secondary | ICD-10-CM | POA: Diagnosis not present

## 2022-10-08 DIAGNOSIS — H35341 Macular cyst, hole, or pseudohole, right eye: Secondary | ICD-10-CM | POA: Diagnosis not present

## 2022-10-08 DIAGNOSIS — Z09 Encounter for follow-up examination after completed treatment for conditions other than malignant neoplasm: Secondary | ICD-10-CM | POA: Diagnosis not present

## 2023-01-02 DIAGNOSIS — H35373 Puckering of macula, bilateral: Secondary | ICD-10-CM | POA: Diagnosis not present

## 2023-01-02 DIAGNOSIS — H35342 Macular cyst, hole, or pseudohole, left eye: Secondary | ICD-10-CM | POA: Diagnosis not present

## 2023-01-02 DIAGNOSIS — H35341 Macular cyst, hole, or pseudohole, right eye: Secondary | ICD-10-CM | POA: Diagnosis not present

## 2023-01-26 DIAGNOSIS — N201 Calculus of ureter: Secondary | ICD-10-CM | POA: Diagnosis not present

## 2023-01-26 DIAGNOSIS — R3915 Urgency of urination: Secondary | ICD-10-CM | POA: Diagnosis not present

## 2023-05-12 DIAGNOSIS — E7800 Pure hypercholesterolemia, unspecified: Secondary | ICD-10-CM | POA: Diagnosis not present

## 2023-05-12 DIAGNOSIS — Z79899 Other long term (current) drug therapy: Secondary | ICD-10-CM | POA: Diagnosis not present

## 2023-05-12 DIAGNOSIS — I700 Atherosclerosis of aorta: Secondary | ICD-10-CM | POA: Diagnosis not present

## 2023-05-12 DIAGNOSIS — Z9181 History of falling: Secondary | ICD-10-CM | POA: Diagnosis not present

## 2023-05-12 DIAGNOSIS — R7301 Impaired fasting glucose: Secondary | ICD-10-CM | POA: Diagnosis not present

## 2023-05-12 DIAGNOSIS — Z1211 Encounter for screening for malignant neoplasm of colon: Secondary | ICD-10-CM | POA: Diagnosis not present

## 2023-05-12 DIAGNOSIS — Z0000 Encounter for general adult medical examination without abnormal findings: Secondary | ICD-10-CM | POA: Diagnosis not present

## 2023-06-25 DIAGNOSIS — Z1211 Encounter for screening for malignant neoplasm of colon: Secondary | ICD-10-CM | POA: Diagnosis not present

## 2023-09-23 DIAGNOSIS — Z1211 Encounter for screening for malignant neoplasm of colon: Secondary | ICD-10-CM | POA: Diagnosis not present

## 2023-09-27 LAB — COLOGUARD: COLOGUARD: NEGATIVE

## 2023-12-05 DIAGNOSIS — G5701 Lesion of sciatic nerve, right lower limb: Secondary | ICD-10-CM | POA: Diagnosis not present

## 2023-12-15 DIAGNOSIS — G5701 Lesion of sciatic nerve, right lower limb: Secondary | ICD-10-CM | POA: Diagnosis not present

## 2023-12-15 DIAGNOSIS — R634 Abnormal weight loss: Secondary | ICD-10-CM | POA: Diagnosis not present

## 2023-12-24 DIAGNOSIS — M25551 Pain in right hip: Secondary | ICD-10-CM | POA: Diagnosis not present

## 2023-12-31 DIAGNOSIS — M25551 Pain in right hip: Secondary | ICD-10-CM | POA: Diagnosis not present

## 2024-01-04 DIAGNOSIS — M25551 Pain in right hip: Secondary | ICD-10-CM | POA: Diagnosis not present

## 2024-01-07 DIAGNOSIS — M25551 Pain in right hip: Secondary | ICD-10-CM | POA: Diagnosis not present

## 2024-01-12 DIAGNOSIS — M25551 Pain in right hip: Secondary | ICD-10-CM | POA: Diagnosis not present

## 2024-01-18 DIAGNOSIS — M25551 Pain in right hip: Secondary | ICD-10-CM | POA: Diagnosis not present

## 2024-01-21 DIAGNOSIS — M25551 Pain in right hip: Secondary | ICD-10-CM | POA: Diagnosis not present

## 2024-01-25 DIAGNOSIS — M25551 Pain in right hip: Secondary | ICD-10-CM | POA: Diagnosis not present

## 2024-01-28 DIAGNOSIS — M25551 Pain in right hip: Secondary | ICD-10-CM | POA: Diagnosis not present

## 2024-02-03 DIAGNOSIS — M25551 Pain in right hip: Secondary | ICD-10-CM | POA: Diagnosis not present

## 2024-03-10 DIAGNOSIS — R3915 Urgency of urination: Secondary | ICD-10-CM | POA: Diagnosis not present

## 2024-05-24 DIAGNOSIS — Z1211 Encounter for screening for malignant neoplasm of colon: Secondary | ICD-10-CM | POA: Diagnosis not present

## 2024-05-24 DIAGNOSIS — Z79899 Other long term (current) drug therapy: Secondary | ICD-10-CM | POA: Diagnosis not present

## 2024-05-24 DIAGNOSIS — E7800 Pure hypercholesterolemia, unspecified: Secondary | ICD-10-CM | POA: Diagnosis not present

## 2024-05-24 DIAGNOSIS — Z0000 Encounter for general adult medical examination without abnormal findings: Secondary | ICD-10-CM | POA: Diagnosis not present

## 2024-05-24 DIAGNOSIS — R0789 Other chest pain: Secondary | ICD-10-CM | POA: Diagnosis not present

## 2024-05-24 DIAGNOSIS — R7301 Impaired fasting glucose: Secondary | ICD-10-CM | POA: Diagnosis not present

## 2024-05-25 DIAGNOSIS — Z1211 Encounter for screening for malignant neoplasm of colon: Secondary | ICD-10-CM | POA: Diagnosis not present

## 2024-06-09 DIAGNOSIS — L821 Other seborrheic keratosis: Secondary | ICD-10-CM | POA: Diagnosis not present

## 2024-06-09 DIAGNOSIS — L918 Other hypertrophic disorders of the skin: Secondary | ICD-10-CM | POA: Diagnosis not present

## 2024-06-09 DIAGNOSIS — B078 Other viral warts: Secondary | ICD-10-CM | POA: Diagnosis not present

## 2024-06-09 DIAGNOSIS — L820 Inflamed seborrheic keratosis: Secondary | ICD-10-CM | POA: Diagnosis not present

## 2024-09-01 DIAGNOSIS — M25562 Pain in left knee: Secondary | ICD-10-CM | POA: Diagnosis not present

## 2024-09-01 DIAGNOSIS — M1712 Unilateral primary osteoarthritis, left knee: Secondary | ICD-10-CM | POA: Diagnosis not present

## 2024-09-01 DIAGNOSIS — R262 Difficulty in walking, not elsewhere classified: Secondary | ICD-10-CM | POA: Diagnosis not present

## 2024-09-01 DIAGNOSIS — M25662 Stiffness of left knee, not elsewhere classified: Secondary | ICD-10-CM | POA: Diagnosis not present

## 2024-09-08 DIAGNOSIS — M25562 Pain in left knee: Secondary | ICD-10-CM | POA: Diagnosis not present

## 2024-09-08 DIAGNOSIS — M1712 Unilateral primary osteoarthritis, left knee: Secondary | ICD-10-CM | POA: Diagnosis not present

## 2024-09-08 DIAGNOSIS — R262 Difficulty in walking, not elsewhere classified: Secondary | ICD-10-CM | POA: Diagnosis not present

## 2024-09-08 DIAGNOSIS — M25662 Stiffness of left knee, not elsewhere classified: Secondary | ICD-10-CM | POA: Diagnosis not present

## 2024-09-15 DIAGNOSIS — M25562 Pain in left knee: Secondary | ICD-10-CM | POA: Diagnosis not present

## 2024-09-15 DIAGNOSIS — M1712 Unilateral primary osteoarthritis, left knee: Secondary | ICD-10-CM | POA: Diagnosis not present

## 2024-09-15 DIAGNOSIS — M25662 Stiffness of left knee, not elsewhere classified: Secondary | ICD-10-CM | POA: Diagnosis not present

## 2024-09-15 DIAGNOSIS — R262 Difficulty in walking, not elsewhere classified: Secondary | ICD-10-CM | POA: Diagnosis not present

## 2024-10-12 ENCOUNTER — Encounter: Payer: Self-pay | Admitting: Student

## 2024-10-12 ENCOUNTER — Ambulatory Visit: Attending: Cardiovascular Disease | Admitting: Student

## 2024-10-12 ENCOUNTER — Ambulatory Visit

## 2024-10-12 VITALS — BP 122/74 | HR 81 | Ht 72.0 in | Wt 151.0 lb

## 2024-10-12 DIAGNOSIS — R0609 Other forms of dyspnea: Secondary | ICD-10-CM

## 2024-10-12 DIAGNOSIS — R0789 Other chest pain: Secondary | ICD-10-CM

## 2024-10-12 DIAGNOSIS — R002 Palpitations: Secondary | ICD-10-CM | POA: Diagnosis not present

## 2024-10-12 MED ORDER — METOPROLOL TARTRATE 100 MG PO TABS: ORAL_TABLET | ORAL | 0 refills | Status: DC

## 2024-10-12 NOTE — Patient Instructions (Addendum)
 Thank you for choosing [facility]!     Medication Instructions:  Take the Metoprolol Tartrate 100mg  tablet 2 hours before the CTA procedure.   *If you need a refill on your cardiac medications before your next appointment, please call your pharmacy*   Lab Work: BMET, MAGNESIUM If you have labs (blood work) drawn today and your tests are completely normal, you will receive your results only by: MyChart Message (if you have MyChart) OR A paper copy in the mail If you have any lab test that is abnormal or we need to change your treatment, we will call you to review the results.   Testing/Procedures:   Your cardiac CT will be scheduled at one of the below locations:   [facility] [address] [place], [place] [address] [phone] (Severe contrast allergies only)  OR   [facility] [address] [place], [place] [address] [phone]  OR   [facility] [address] [place], [place] [address] [phone]  OR   [facility] V[facility] Tower [address]  [place], [place] [address] [phone]  OR   [facility] [address] [place], [place] [phone]  If scheduled at [facility], please arrive at the [facility] and [facility] Entrance (Entrance C2) of [facility] 30 minutes prior to test start time. You can use the FREE valet parking offered at entrance C (encouraged to control the heart rate for the test)  Proceed to the [facility] Radiology Department (first floor) to check-in and test prep.  All radiology patients and guests should use entrance C2 at [facility], accessed from [facility], even though the hospital's physical address listed is [address]  If scheduled at the Heart and Vascular Tower at [address], please enter the parking lot using the [address] entrance and use the FREE valet service at the  patient drop-off area. Enter the building and check-in with registration on the main floor.  If scheduled at [facility], please arrive to the Heart and Vascular Center 15 mins early for check-in and test prep.  There is spacious parking and easy access to the radiology department from the [facility] entrance. Please enter here and check-in with the desk attendant.   If scheduled at [facility], please arrive 30 minutes early for check-in and test prep.  Please follow these instructions carefully (unless otherwise directed):  An IV will be required for this test and Nitroglycerin will be given.  Hold all erectile dysfunction medications at least 3 days (72 hrs) prior to test. (Ie viagra, cialis, sildenafil, tadalafil, etc)   On the Night Before the Test: Be sure to Drink plenty of water. Do not consume any caffeinated/decaffeinated beverages or chocolate 12 hours prior to your test. Do not take any antihistamines 12 hours prior to your test.  If the patient has contrast allergy: Patient will need a prescription for Prednisone and very clear instructions (as follows): Prednisone 50 mg - take 13 hours prior to test Take another Prednisone 50 mg 7 hours prior to test Take another Prednisone 50 mg 1 hour prior to test Take Benadryl 50 mg 1 hour prior to test Patient must complete all four doses of above prophylactic medications. Patient will need a ride after test due to Benadryl.  On the Day of the Test: Drink plenty of water until 1 hour prior to the  test. Do not eat any food 1 hour prior to test. You may take your regular medications prior to the test.  Take metoprolol (Lopressor) two hours prior to test. If you take Furosemide/Hydrochlorothiazide/Spironolactone/Chlorthalidone, please HOLD on the morning of the test. Patients who wear a continuous glucose monitor MUST remove the device prior to scanning. FEMALES- please wear underwire-free  bra if available, avoid dresses & tight clothing   After the Test: Drink plenty of water. After receiving IV contrast, you may experience a mild flushed feeling. This is normal. On occasion, you may experience a mild rash up to 24 hours after the test. This is not dangerous. If this occurs, you can take Benadryl 25 mg, Zyrtec, Claritin, or Allegra and increase your fluid intake. (Patients taking Tikosyn should avoid Benadryl, and may take Zyrtec, Claritin, or Allegra) If you experience trouble breathing, this can be serious. If it is severe call 911 IMMEDIATELY. If it is mild, please call our office.  We will call to schedule your test 2-4 weeks out understanding that some insurance companies will need an authorization prior to the service being performed.   For more information and frequently asked questions, please visit our website : [email or web address]  For non-scheduling related questions, please contact the cardiac imaging nurse navigator should you have any questions/concerns: Cardiac Imaging Nurse Navigators Direct Office Dial: [phone]   For scheduling needs, including cancellations and rescheduling, please call [place], [phone].       ZIO XT- Long Term Monitor Instructions   Your physician has requested you wear your ZIO patch monitor 14 days.   This is a single patch monitor.  [employer] supplies one patch monitor per enrollment.  Additional stickers are not available.   Please do not apply patch if you will be having a Nuclear Stress Test, Echocardiogram, Cardiac CT, MRI, or Chest Xray during the time frame you would be wearing the monitor. The patch cannot be worn during these tests.  You cannot remove and re-apply the ZIO XT patch monitor.   Your ZIO patch monitor will be sent USPS Priority mail from [facility] directly to your home address. The monitor may also be mailed to a PO BOX if home delivery is not available.   It may take 3-5 days  to receive your monitor after you have been enrolled.   Once you have received you monitor, please review enclosed instructions.  Your monitor has already been registered assigning a specific monitor serial # to you.   Applying the monitor   Shave hair from upper left chest.   Hold abrader disc by orange tab.  Rub abrader in 40 strokes over left upper chest as indicated in your monitor instructions.   Clean area with 4 enclosed alcohol pads .  Use all pads to assure are is cleaned thoroughly.  Let dry.   Apply patch as indicated in monitor instructions.  Patch will be place under collarbone on left side of chest with arrow pointing upward.   Rub patch adhesive wings for 2 minutes.Remove white label marked 1.  Remove white label marked 2.  Rub patch adhesive wings for 2 additional minutes.   While looking in a mirror, press and release button in center of patch.  A small green light will flash 3-4 times .  This will be your only indicator the monitor has been turned on.     Do not shower for the first 24 hours.  You may shower after the first 24 hours.   Press  button if you feel a symptom. You will hear a small click.  Record Date, Time and Symptom in the Patient Log Book.   When you are ready to remove patch, follow instructions on last 2 pages of Patient Log Book.  Stick patch monitor onto last page of Patient Log Book.   Place Patient Log Book in [place] box.  Use locking tab on box and tape box closed securely.  The [employer] has [employer] on it.  Please place in mailbox as soon as possible.  Your physician should have your test results approximately 7 days after the monitor has been mailed back to [facility].   Call [facility] Customer Care at [phone] if you have questions regarding your ZIO XT patch monitor.  Call them immediately if you see an orange light blinking on your monitor.   If your monitor falls off in less than 4 days contact our Monitor  department at [phone].  If your monitor becomes loose or falls off after 4 days call [employer] at [phone] for suggestions on securing your monitor.    Your next appointment:  Based on the completion of testing      Provider:   Aline Door, PA-C           Follow-Up: At [facility], you and your health needs are our priority.  As part of our continuing mission to provide you with exceptional heart care, we have created designated Provider Care Teams.  These Care Teams include your primary Cardiologist (physician) and Advanced Practice Providers (APPs -  Physician Assistants and Nurse Practitioners) who all work together to provide you with the care you need, when you need it. We recommend signing up for the patient portal called MyChart.  Sign up information is provided on this After Visit Summary.  MyChart is used to connect with patients for Virtual Visits (Telemedicine).  Patients are able to view lab/test results, encounter notes, upcoming appointments, etc.  Non-urgent messages can be sent to your provider as well.   To learn more about what you can do with MyChart, go to [email or web address].

## 2024-10-12 NOTE — Progress Notes (Unsigned)
 Applied a 14 day Zio XT monitor to patient in the office  DOD to read

## 2024-10-12 NOTE — H&P (View-Only) (Signed)
 [facility]:    Date:  10/12/2024   ID:  Jon Terry, DOB 1951/02/11, MRN [id number]  PCP:  Regino Slater, MD  Cardiologist: New - Dr. Francyne (DOD) Click to update primary MD,subspecialty MD or APP then REFRESH:1}    Referring MD: Regino Slater, MD   Chief Complaint: palpitations   History of Present Illness:    Jon Terry is a 73 y.o. male with a history of prediabetes, GERD, sciatica, peyronie disease, nephrolithiasis, and anxiety/ depression who presents today as a new [facility] for further evaluation of palpitations.  Patient was recently seen by PCP in 04/2024 and reported chest tightness at that time. He was referred to Cardiology for further evaluation.   Patient was referred for chest pain but his main complaint today is palpitations.  He describes a fluttering sensation at night when he lays down to go to sleep that lasts for 10 minutes.  He denies any palpitations during the day.  He states this has been going on for about the past 6 months.  He reports very atypical chest pain that he describes as almost a jolt sensation that occurs at rest and only last for couple seconds and then resolves.  He is very active and denies any exertional symptoms.  He push mows his yard (which is about one third of an acre) and has no problems with this.  He does report some new dyspnea on exertion, specifically when going up the stairs in his house) for the past year.  He does okay with gradual inclines but has dyspnea with steep inclines.  He denies any shortness of breath at rest.  No orthopnea, PND, edema, lightheadedness, dizziness, syncope.  He has prior history of tobacco use but quit looking in 1994.  He drinks about 1-2 beers per day.  He also reports using hemp products over the last couple years and is wondering if this could be contributing to his palpitations.  He states he has been consuming a lot of dairy products because he has a  history of kidney stones and was told dairy products can help combat oxalates.  He is worried that he may have blockages in his heart but because of his increased dairy intake.  His father died from an MI in his 58s.  He denies any other family history of heart disease.  Recent labs from PCP's office in 04/2024 reviewed: - CMET: Na 140, K 4.0, Glucose 97, BUN 14, Cr 0.76, Albumin 4.2, AST 16, ALT 9, Alk Phos 57, Total Bili 0.5 - CBC: WBC 4.7, Hgb 14.9, Plts 178 - Lipid panel: Total Cholesterol 150, Triglycerides 91, HDL 36, LDL 97 - Hemoglobin A1c 5.7% - TSH 0.84 (normal)  EKGs/Labs/Other Studies Reviewed:    The following studies were reviewed: N/A  EKG:  EKG ordered today.   EKG Interpretation Date/Time:  Wednesday October 12 2024 14:07:32 EDT Ventricular Rate:  75 PR Interval:  140 QRS Duration:  86 QT Interval:  368 QTC Calculation: 410 R Axis:   20  Text Interpretation: Normal sinus rhythm Normal ECG When compared with ECG of 07-May-2009 08:17, No significant change was found Confirmed by Jadine Patient [phone]) on 10/12/2024 2:11:11 PM    Recent Labs: No results found for requested labs within last 365 days.  Recent Lipid Panel No results found for: CHOL, TRIG, HDL, CHOLHDL, VLDL, LDLCALC, LDLDIRECT  Physical Exam:    Vital Signs: BP 122/74   Pulse 81  Ht 6' (1.829 m)   Wt 151 lb (68.5 kg)   SpO2 94%   BMI 20.48 kg/m     Wt Readings from Last 3 Encounters:  10/12/24 151 lb (68.5 kg)  09/21/19 166 lb 4 oz (75.4 kg)  09/20/19 167 lb 8.8 oz (76 kg)     General: 73 y.o.  thin Caucasian male in no acute distress. HEENT: Normocephalic and atraumatic. Sclera clear.  Neck: Supple. No carotid bruits. No JVD. Heart: RRR. Distinct S1 and S2. No murmurs, gallops, or rubs.  Lungs: No increased work of breathing. Clear to ausculation bilaterally. No wheezes, rhonchi, or rales.  Extremities: No lower extremity edema.   Skin: Warm and dry. Neuro: No  focal deficits. Psych: Normal affect. Responds appropriately.  Assessment:    1. Palpitations   2. Atypical chest pain   3. DOE (dyspnea on exertion)     Plan:    Palpitations Patient reports palpitations for the last 6 months that he describes as a fluttering sensation at night that last for about 10 minutes at a time.  Denies any palpitations during the day.  No associated lightheadedness/dizziness or syncope. - EKG today shows normal sinus rhythm with no ectopy. - Will check BMET and Magnesium today. TSH was normal in 04/2024. - Will order 2 week Zio monitor. - Recommended cessation of hemp products as this can cause tachycardia.  Atypical Chest Pain Dyspnea on Exertion  Patient reports atypical chest pain as well as new dyspnea on exertion when walking up steep or incline such as the stairs in his house over the last year. - EKG shows no acute ischemic changes. - Chest pain does not sound cardiac in nature. However, discussed that dyspnea could be an anginal equivalent. He is also concerned that he could have CAD given high dairy intake. Discussed coronary CTA and Echo. He would like to get the coronary CTA but would like to hold off on the Echo (he is worried that Medicare will not pay for all of these). Will provide a one-time dose of Lopressor 100 mg for him to take an hour and 1/2 to 2 hours prior to CTA.  Will check BMET today.   Disposition: Follow up in 1-2 months after above studies have been completed.    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 FFR showed probable hemodynamically flow limiting lesions in the proximal LCX/ ostial OM1 and proximal to mid LAD. Dr. Shlomo who read the coronary CTA recommended cardiac  catheterization.   I called and discussed results with coronary CTA and recommendation for cardiac catheterization. He was agreeable to this. Will start Aspirin 81mg  daily and Crestor 20mg  daily. Will also prescribe sublingual Nitroglycerin. Will need repeat lipid panel and LFTs in about 2-3 months which can be ordered at follow-up visit.  Shared Decision Making/Informed Consent{ The risks [stroke (1 in 1000), death (1 in 1000), kidney failure [usually temporary] (1 in 500), bleeding (1 in 200), allergic reaction [possibly serious] (1 in 200)], benefits (diagnostic support and management of coronary artery disease) and alternatives of a cardiac catheterization were discussed in detail with Mr. Scala and he is willing to proceed.     Tytionna Cloyd E Taronda Comacho, PA-C 10/29/2024 5:43 PM

## 2024-10-12 NOTE — Progress Notes (Addendum)
 [facility]:    Date:  10/12/2024   ID:  [name], DOB 1951/02/11, MRN [id number]  PCP:  Regino Slater, MD  Cardiologist: New - Dr. Francyne (DOD) Click to update primary MD,subspecialty MD or APP then REFRESH:1}    Referring MD: Regino Slater, MD   Chief Complaint: palpitations   History of Present Illness:    Jon Terry is a 73 y.o. male with a history of prediabetes, GERD, sciatica, peyronie disease, nephrolithiasis, and anxiety/ depression who presents today as a new [facility] for further evaluation of palpitations.  Patient was recently seen by PCP in 04/2024 and reported chest tightness at that time. He was referred to Cardiology for further evaluation.   Patient was referred for chest pain but his main complaint today is palpitations.  He describes a fluttering sensation at night when he lays down to go to sleep that lasts for 10 minutes.  He denies any palpitations during the day.  He states this has been going on for about the past 6 months.  He reports very atypical chest pain that he describes as almost a jolt sensation that occurs at rest and only last for couple seconds and then resolves.  He is very active and denies any exertional symptoms.  He push mows his yard (which is about one third of an acre) and has no problems with this.  He does report some new dyspnea on exertion, specifically when going up the stairs in his house) for the past year.  He does okay with gradual inclines but has dyspnea with steep inclines.  He denies any shortness of breath at rest.  No orthopnea, PND, edema, lightheadedness, dizziness, syncope.  He has prior history of tobacco use but quit looking in 1994.  He drinks about 1-2 beers per day.  He also reports using hemp products over the last couple years and is wondering if this could be contributing to his palpitations.  He states he has been consuming a lot of dairy products because he has a  history of kidney stones and was told dairy products can help combat oxalates.  He is worried that he may have blockages in his heart but because of his increased dairy intake.  His father died from an MI in his 58s.  He denies any other family history of heart disease.  Recent labs from PCP's office in 04/2024 reviewed: - CMET: Na 140, K 4.0, Glucose 97, BUN 14, Cr 0.76, Albumin 4.2, AST 16, ALT 9, Alk Phos 57, Total Bili 0.5 - CBC: WBC 4.7, Hgb 14.9, Plts 178 - Lipid panel: Total Cholesterol 150, Triglycerides 91, HDL 36, LDL 97 - Hemoglobin A1c 5.7% - TSH 0.84 (normal)  EKGs/Labs/Other Studies Reviewed:    The following studies were reviewed: N/A  EKG:  EKG ordered today.   EKG Interpretation Date/Time:  Wednesday October 12 2024 14:07:32 EDT Ventricular Rate:  75 PR Interval:  140 QRS Duration:  86 QT Interval:  368 QTC Calculation: 410 R Axis:   20  Text Interpretation: Normal sinus rhythm Normal ECG When compared with ECG of 07-May-2009 08:17, No significant change was found Confirmed by Jadine Patient [phone]) on 10/12/2024 2:11:11 PM    Recent Labs: No results found for requested labs within last 365 days.  Recent Lipid Panel No results found for: CHOL, TRIG, HDL, CHOLHDL, VLDL, LDLCALC, LDLDIRECT  Physical Exam:    Vital Signs: BP 122/74   Pulse 81  Ht 6' (1.829 m)   Wt 151 lb (68.5 kg)   SpO2 94%   BMI 20.48 kg/m     Wt Readings from Last 3 Encounters:  10/12/24 151 lb (68.5 kg)  09/21/19 166 lb 4 oz (75.4 kg)  09/20/19 167 lb 8.8 oz (76 kg)     General: 73 y.o.  thin Caucasian male in no acute distress. HEENT: Normocephalic and atraumatic. Sclera clear.  Neck: Supple. No carotid bruits. No JVD. Heart: RRR. Distinct S1 and S2. No murmurs, gallops, or rubs.  Lungs: No increased work of breathing. Clear to ausculation bilaterally. No wheezes, rhonchi, or rales.  Extremities: No lower extremity edema.   Skin: Warm and dry. Neuro: No  focal deficits. Psych: Normal affect. Responds appropriately.  Assessment:    1. Palpitations   2. Atypical chest pain   3. DOE (dyspnea on exertion)     Plan:    Palpitations Patient reports palpitations for the last 6 months that he describes as a fluttering sensation at night that last for about 10 minutes at a time.  Denies any palpitations during the day.  No associated lightheadedness/dizziness or syncope. - EKG today shows normal sinus rhythm with no ectopy. - Will check BMET and Magnesium today. TSH was normal in 04/2024. - Will order 2 week Zio monitor. - Recommended cessation of hemp products as this can cause tachycardia.  Atypical Chest Pain Dyspnea on Exertion  Patient reports atypical chest pain as well as new dyspnea on exertion when walking up steep or incline such as the stairs in his house over the last year. - EKG shows no acute ischemic changes. - Chest pain does not sound cardiac in nature. However, discussed that dyspnea could be an anginal equivalent. He is also concerned that he could have CAD given high dairy intake. Discussed coronary CTA and Echo. He would like to get the coronary CTA but would like to hold off on the Echo (he is worried that Medicare will not pay for all of these). Will provide a one-time dose of Lopressor 100 mg for him to take an hour and 1/2 to 2 hours prior to CTA.  Will check BMET today.   Disposition: Follow up in 1-2 months after above studies have been completed.    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 FFR showed probable hemodynamically flow limiting lesions in the proximal LCX/ ostial OM1 and proximal to mid LAD. Dr. Shlomo who read the coronary CTA recommended cardiac  catheterization.   I called and discussed results with coronary CTA and recommendation for cardiac catheterization. He was agreeable to this. Will start Aspirin 81mg  daily and Crestor 20mg  daily. Will also prescribe sublingual Nitroglycerin. Will need repeat lipid panel and LFTs in about 2-3 months which can be ordered at follow-up visit.  Shared Decision Making/Informed Consent{ The risks [stroke (1 in 1000), death (1 in 1000), kidney failure [usually temporary] (1 in 500), bleeding (1 in 200), allergic reaction [possibly serious] (1 in 200)], benefits (diagnostic support and management of coronary artery disease) and alternatives of a cardiac catheterization were discussed in detail with Mr. Scala and he is willing to proceed.     Tytionna Cloyd E Taronda Comacho, PA-C 10/29/2024 5:43 PM

## 2024-10-13 LAB — BASIC METABOLIC PANEL WITH GFR
BUN/Creatinine Ratio: 18 (ref 10–24)
BUN: 16 mg/dL (ref 8–27)
CO2: 24 mmol/L (ref 20–29)
Calcium: 9.8 mg/dL (ref 8.6–10.2)
Chloride: 100 mmol/L (ref 96–106)
Creatinine, Ser: 0.87 mg/dL (ref 0.76–1.27)
Glucose: 76 mg/dL (ref 70–99)
Potassium: 4.2 mmol/L (ref 3.5–5.2)
Sodium: 138 mmol/L (ref 134–144)
eGFR: 91 mL/min/1.73 (ref >59)

## 2024-10-13 LAB — MAGNESIUM: Magnesium: 2.3 mg/dL (ref 1.6–2.3)

## 2024-10-14 ENCOUNTER — Ambulatory Visit: Payer: Self-pay | Admitting: Student

## 2024-10-21 ENCOUNTER — Encounter (HOSPITAL_COMMUNITY): Payer: Self-pay

## 2024-10-25 ENCOUNTER — Ambulatory Visit (HOSPITAL_COMMUNITY)
Admission: RE | Admit: 2024-10-25 | Discharge: 2024-10-25 | Disposition: A | Discharge status: Home / Self Care | Source: Ambulatory Visit | Attending: Student | Admitting: Student

## 2024-10-25 DIAGNOSIS — I2510 Atherosclerotic heart disease of native coronary artery without angina pectoris: Secondary | ICD-10-CM | POA: Diagnosis not present

## 2024-10-25 DIAGNOSIS — R002 Palpitations: Secondary | ICD-10-CM | POA: Insufficient documentation

## 2024-10-25 DIAGNOSIS — R931 Abnormal findings on diagnostic imaging of heart and coronary circulation: Secondary | ICD-10-CM | POA: Diagnosis present

## 2024-10-25 DIAGNOSIS — R0609 Other forms of dyspnea: Secondary | ICD-10-CM | POA: Insufficient documentation

## 2024-10-25 MED ORDER — NITROGLYCERIN 0.4 MG SL SUBL
0.8000 mg | SUBLINGUAL_TABLET | Freq: Once | SUBLINGUAL | Status: AC
  Administered 2024-10-25: 0.8 mg via SUBLINGUAL

## 2024-10-25 MED ORDER — IOHEXOL 350 MG/ML SOLN
100.0000 mL | Freq: Once | INTRAVENOUS | Status: AC | PRN
  Administered 2024-10-25: 100 mL via INTRAVENOUS

## 2024-10-26 ENCOUNTER — Ambulatory Visit (HOSPITAL_BASED_OUTPATIENT_CLINIC_OR_DEPARTMENT_OTHER)
Admission: RE | Admit: 2024-10-26 | Discharge: 2024-10-26 | Disposition: A | Discharge status: Home / Self Care | Source: Ambulatory Visit | Attending: Cardiology | Admitting: Cardiology

## 2024-10-26 ENCOUNTER — Other Ambulatory Visit: Payer: Self-pay | Admitting: Cardiology

## 2024-10-26 DIAGNOSIS — R931 Abnormal findings on diagnostic imaging of heart and coronary circulation: Secondary | ICD-10-CM | POA: Diagnosis not present

## 2024-10-26 DIAGNOSIS — I2510 Atherosclerotic heart disease of native coronary artery without angina pectoris: Secondary | ICD-10-CM | POA: Diagnosis not present

## 2024-10-28 ENCOUNTER — Telehealth: Payer: Self-pay

## 2024-10-28 NOTE — Telephone Encounter (Signed)
 Patient returned staff call regarding results.

## 2024-10-28 NOTE — Telephone Encounter (Signed)
 Left vm for pt per Callie's request. Coronary CTA came back abnormal and Dr. Shlomo who read it recommends a cath. Callie will reach out to patient to explain CTA and why cath is needed.

## 2024-10-29 ENCOUNTER — Ambulatory Visit: Payer: Self-pay | Admitting: Student

## 2024-10-29 DIAGNOSIS — R0609 Other forms of dyspnea: Secondary | ICD-10-CM

## 2024-10-29 DIAGNOSIS — I2510 Atherosclerotic heart disease of native coronary artery without angina pectoris: Secondary | ICD-10-CM

## 2024-10-29 MED ORDER — ASPIRIN 81 MG PO TBEC: 81.0000 mg | DELAYED_RELEASE_TABLET | Freq: Every day | ORAL | Status: DC

## 2024-10-29 MED ORDER — NITROGLYCERIN 0.4 MG SL SUBL: 0.4000 mg | SUBLINGUAL_TABLET | SUBLINGUAL | 2 refills | Status: DC | PRN

## 2024-10-29 MED ORDER — ROSUVASTATIN CALCIUM 20 MG PO TABS: 20.0000 mg | ORAL_TABLET | Freq: Every day | ORAL | 2 refills | Status: DC

## 2024-10-29 NOTE — Telephone Encounter (Addendum)
 Tried to call patient to discuss coronary CTA but had to leave a voicemail. Will try again later.  Jaleyah Longhi E Samyak Sackmann, PA-C 10/29/2024 8:48 AM  Patient called back. I reviewed coronary CTA results and recommendation for cardiac catheterization. He is agreeable to this. Triage, do you mind helping me arrange this  (sorry, I think Anitra is gone this week)? I will put in pre-cath orders once date is set.  Thank you so much! Ivan Maskell

## 2024-10-31 ENCOUNTER — Other Ambulatory Visit: Payer: Self-pay | Admitting: *Deleted

## 2024-10-31 DIAGNOSIS — R002 Palpitations: Secondary | ICD-10-CM | POA: Diagnosis not present

## 2024-10-31 DIAGNOSIS — I2510 Atherosclerotic heart disease of native coronary artery without angina pectoris: Secondary | ICD-10-CM

## 2024-10-31 NOTE — Telephone Encounter (Signed)
 Hey [place] and [place]!  I'm sorry to bother y'all with this, but is this something y'all could help me with? He had an abnormal coronary CTA and needs a cath. I am off tomorrow but will be back in clinic on Wednesday, so I can ask whoever is covering me that day to help with this if that is easier.   Thank you! Jaileen Janelle

## 2024-11-01 ENCOUNTER — Ambulatory Visit (HOSPITAL_COMMUNITY)
Admission: RE | Admit: 2024-11-01 | Discharge: 2024-11-01 | Disposition: A | Discharge status: Home / Self Care | Source: Ambulatory Visit | Attending: Cardiology | Admitting: Cardiology

## 2024-11-01 DIAGNOSIS — R0609 Other forms of dyspnea: Secondary | ICD-10-CM | POA: Diagnosis not present

## 2024-11-01 DIAGNOSIS — I2510 Atherosclerotic heart disease of native coronary artery without angina pectoris: Secondary | ICD-10-CM | POA: Insufficient documentation

## 2024-11-01 LAB — CBC
Hematocrit: 44.4 % (ref 37.5–51.0)
Hemoglobin: 14.6 g/dL (ref 13.0–17.7)
MCH: 29.9 pg (ref 26.6–33.0)
MCHC: 32.9 g/dL (ref 31.5–35.7)
MCV: 91 fL (ref 79–97)
Platelets: 193 x10E3/uL (ref 150–450)
RBC: 4.88 x10E6/uL (ref 4.14–5.80)
RDW: 12.9 % (ref 11.6–15.4)
WBC: 5.2 x10E3/uL (ref 3.4–10.8)

## 2024-11-01 LAB — ECHOCARDIOGRAM COMPLETE
Area-P 1/2: 2.6 cm2
P 1/2 time: 931 ms
S' Lateral: 3.1 cm

## 2024-11-01 NOTE — Telephone Encounter (Signed)
 I spoke with patient and he can do cath this week.  Cath scheduled for 11/6 at 7:30 with Dr Mady

## 2024-11-01 NOTE — Telephone Encounter (Signed)
 I spoke with patient and reviewed cath instructions with him. He would like to move cath to later in the day.  Time changed to noon. I went over updated instructions with patient and will send new copy to him through my chart.

## 2024-11-02 ENCOUNTER — Ambulatory Visit: Payer: Self-pay | Admitting: Student

## 2024-11-03 ENCOUNTER — Other Ambulatory Visit: Payer: Self-pay

## 2024-11-03 ENCOUNTER — Inpatient Hospital Stay (HOSPITAL_COMMUNITY)

## 2024-11-03 ENCOUNTER — Encounter (HOSPITAL_COMMUNITY): Payer: Self-pay | Admitting: Internal Medicine

## 2024-11-03 ENCOUNTER — Inpatient Hospital Stay (HOSPITAL_COMMUNITY)
Admission: AD | Admit: 2024-11-03 | Discharge: 2024-11-08 | DRG: 234 | Disposition: A | Discharge status: Home / Self Care | Attending: Internal Medicine | Admitting: Internal Medicine

## 2024-11-03 ENCOUNTER — Encounter (HOSPITAL_COMMUNITY): Admission: AD | Disposition: A | Payer: Self-pay | Source: Home / Self Care | Attending: Internal Medicine

## 2024-11-03 DIAGNOSIS — Z87891 Personal history of nicotine dependence: Secondary | ICD-10-CM

## 2024-11-03 DIAGNOSIS — Z7982 Long term (current) use of aspirin: Secondary | ICD-10-CM | POA: Diagnosis not present

## 2024-11-03 DIAGNOSIS — F32A Depression, unspecified: Secondary | ICD-10-CM | POA: Diagnosis present

## 2024-11-03 DIAGNOSIS — D696 Thrombocytopenia, unspecified: Secondary | ICD-10-CM | POA: Diagnosis not present

## 2024-11-03 DIAGNOSIS — D689 Coagulation defect, unspecified: Secondary | ICD-10-CM | POA: Diagnosis not present

## 2024-11-03 DIAGNOSIS — D62 Acute posthemorrhagic anemia: Secondary | ICD-10-CM | POA: Diagnosis not present

## 2024-11-03 DIAGNOSIS — G2581 Restless legs syndrome: Secondary | ICD-10-CM | POA: Diagnosis present

## 2024-11-03 DIAGNOSIS — Z880 Allergy status to penicillin: Secondary | ICD-10-CM

## 2024-11-03 DIAGNOSIS — R739 Hyperglycemia, unspecified: Secondary | ICD-10-CM | POA: Diagnosis present

## 2024-11-03 DIAGNOSIS — I2510 Atherosclerotic heart disease of native coronary artery without angina pectoris: Secondary | ICD-10-CM

## 2024-11-03 DIAGNOSIS — Z8249 Family history of ischemic heart disease and other diseases of the circulatory system: Secondary | ICD-10-CM

## 2024-11-03 DIAGNOSIS — R002 Palpitations: Secondary | ICD-10-CM | POA: Diagnosis present

## 2024-11-03 DIAGNOSIS — Z951 Presence of aortocoronary bypass graft: Secondary | ICD-10-CM | POA: Diagnosis not present

## 2024-11-03 DIAGNOSIS — E873 Alkalosis: Secondary | ICD-10-CM | POA: Diagnosis present

## 2024-11-03 DIAGNOSIS — K219 Gastro-esophageal reflux disease without esophagitis: Secondary | ICD-10-CM | POA: Diagnosis present

## 2024-11-03 DIAGNOSIS — J9811 Atelectasis: Secondary | ICD-10-CM | POA: Diagnosis not present

## 2024-11-03 DIAGNOSIS — Z79899 Other long term (current) drug therapy: Secondary | ICD-10-CM

## 2024-11-03 DIAGNOSIS — E785 Hyperlipidemia, unspecified: Secondary | ICD-10-CM | POA: Diagnosis present

## 2024-11-03 DIAGNOSIS — I25118 Atherosclerotic heart disease of native coronary artery with other forms of angina pectoris: Secondary | ICD-10-CM | POA: Diagnosis not present

## 2024-11-03 DIAGNOSIS — F419 Anxiety disorder, unspecified: Secondary | ICD-10-CM | POA: Diagnosis present

## 2024-11-03 DIAGNOSIS — Z885 Allergy status to narcotic agent status: Secondary | ICD-10-CM | POA: Diagnosis not present

## 2024-11-03 DIAGNOSIS — Z888 Allergy status to other drugs, medicaments and biological substances status: Secondary | ICD-10-CM | POA: Diagnosis not present

## 2024-11-03 DIAGNOSIS — R7303 Prediabetes: Secondary | ICD-10-CM | POA: Diagnosis present

## 2024-11-03 DIAGNOSIS — Z87442 Personal history of urinary calculi: Secondary | ICD-10-CM

## 2024-11-03 DIAGNOSIS — I25110 Atherosclerotic heart disease of native coronary artery with unstable angina pectoris: Principal | ICD-10-CM | POA: Diagnosis present

## 2024-11-03 HISTORY — DX: Restless legs syndrome: G25.81

## 2024-11-03 HISTORY — PX: LEFT HEART CATH AND CORONARY ANGIOGRAPHY: CATH118249

## 2024-11-03 LAB — URINALYSIS, COMPLETE (UACMP) WITH MICROSCOPIC
Bacteria, UA: NONE SEEN
Bilirubin Urine: NEGATIVE
Glucose, UA: NEGATIVE mg/dL
Hgb urine dipstick: NEGATIVE
Ketones, ur: NEGATIVE mg/dL
Leukocytes,Ua: NEGATIVE
Nitrite: NEGATIVE
Protein, ur: NEGATIVE mg/dL
Specific Gravity, Urine: 1.025 (ref 1.005–1.030)
pH: 8 (ref 5.0–8.0)

## 2024-11-03 LAB — PREPARE RBC (CROSSMATCH)

## 2024-11-03 LAB — ABO/RH: ABO/RH(D): AB POS

## 2024-11-03 MED ORDER — HEPARIN 30,000 UNITS/1000 ML (OHS) CELLSAVER SOLUTION
Status: DC
  Filled 2024-11-03: qty 1000

## 2024-11-03 MED ORDER — POLYVINYL ALCOHOL 1.4 % OP SOLN: 1.0000 [drp] | Freq: Every day | OPHTHALMIC | Status: DC | PRN

## 2024-11-03 MED ORDER — VERAPAMIL HCL 2.5 MG/ML IV SOLN
INTRAVENOUS | Status: AC
Start: 2024-11-03 — End: 2024-11-03
  Filled 2024-11-03: qty 2

## 2024-11-03 MED ORDER — TRANEXAMIC ACID (OHS) PUMP PRIME SOLUTION
2.0000 mg/kg | INTRAVENOUS | Status: DC
  Filled 2024-11-03: qty 1.36

## 2024-11-03 MED ORDER — FLUTICASONE PROPIONATE 50 MCG/ACT NA SUSP
1.0000 | Freq: Every day | NASAL | Status: DC
  Filled 2024-11-03 (×2): qty 16

## 2024-11-03 MED ORDER — ONDANSETRON HCL 4 MG/2ML IJ SOLN: 4.0000 mg | Freq: Four times a day (QID) | INTRAMUSCULAR | Status: DC | PRN

## 2024-11-03 MED ORDER — MUPIROCIN 2 % EX OINT
1.0000 | TOPICAL_OINTMENT | Freq: Two times a day (BID) | CUTANEOUS | Status: DC
  Administered 2024-11-03: 1 via NASAL
  Filled 2024-11-03: qty 22

## 2024-11-03 MED ORDER — LABETALOL HCL 5 MG/ML IV SOLN: 10.0000 mg | INTRAVENOUS | Status: AC | PRN

## 2024-11-03 MED ORDER — FREE WATER
500.0000 mL | Freq: Once | Status: AC
  Administered 2024-11-03: 500 mL via ORAL

## 2024-11-03 MED ORDER — MILRINONE LACTATE IN DEXTROSE 20-5 MG/100ML-% IV SOLN
0.3000 ug/kg/min | INTRAVENOUS | Status: DC
  Filled 2024-11-03: qty 100

## 2024-11-03 MED ORDER — EPINEPHRINE HCL 5 MG/250ML IV SOLN IN NS
0.0000 ug/min | INTRAVENOUS | Status: DC
  Filled 2024-11-03: qty 250

## 2024-11-03 MED ORDER — PHENYLEPHRINE HCL-NACL 20-0.9 MG/250ML-% IV SOLN
30.0000 ug/min | INTRAVENOUS | Status: DC
  Filled 2024-11-03: qty 250

## 2024-11-03 MED ORDER — TRANEXAMIC ACID 1000 MG/10ML IV SOLN
1.5000 mg/kg/h | INTRAVENOUS | Status: AC
  Administered 2024-11-04: 1.5 mg/kg/h via INTRAVENOUS
  Filled 2024-11-03: qty 25

## 2024-11-03 MED ORDER — NITROGLYCERIN 1 MG/10 ML FOR IR/CATH LAB
INTRA_ARTERIAL | Status: DC | PRN
  Administered 2024-11-03: 200 ug via INTRACORONARY

## 2024-11-03 MED ORDER — ASPIRIN 81 MG PO CHEW: 81.0000 mg | CHEWABLE_TABLET | ORAL | Status: DC

## 2024-11-03 MED ORDER — FREE WATER: 500.0000 mL | Freq: Once | Status: DC

## 2024-11-03 MED ORDER — NITROGLYCERIN 1 MG/10 ML FOR IR/CATH LAB
INTRA_ARTERIAL | Status: AC
  Filled 2024-11-03: qty 10

## 2024-11-03 MED ORDER — SODIUM CHLORIDE 0.9% FLUSH
3.0000 mL | INTRAVENOUS | Status: DC | PRN
  Administered 2024-11-03: 3 mL via INTRAVENOUS

## 2024-11-03 MED ORDER — HEPARIN (PORCINE) 25000 UT/250ML-% IV SOLN
1000.0000 [IU]/h | INTRAVENOUS | Status: DC
  Administered 2024-11-03: 1000 [IU]/h via INTRAVENOUS
  Filled 2024-11-03: qty 250

## 2024-11-03 MED ORDER — ROSUVASTATIN CALCIUM 20 MG PO TABS
20.0000 mg | ORAL_TABLET | Freq: Every day | ORAL | Status: DC
  Administered 2024-11-05: 20 mg via ORAL
  Filled 2024-11-03: qty 1

## 2024-11-03 MED ORDER — MIDAZOLAM HCL 2 MG/2ML IJ SOLN
INTRAMUSCULAR | Status: AC
  Filled 2024-11-03: qty 2

## 2024-11-03 MED ORDER — VANCOMYCIN HCL 1250 MG/250ML IV SOLN
1250.0000 mg | INTRAVENOUS | Status: DC
  Filled 2024-11-03: qty 250

## 2024-11-03 MED ORDER — INSULIN REGULAR(HUMAN) IN NACL 100-0.9 UT/100ML-% IV SOLN
INTRAVENOUS | Status: AC
  Administered 2024-11-04: .8 [IU]/h via INTRAVENOUS
  Filled 2024-11-03: qty 100

## 2024-11-03 MED ORDER — METOPROLOL TARTRATE 12.5 MG HALF TABLET
12.5000 mg | ORAL_TABLET | Freq: Two times a day (BID) | ORAL | Status: DC
  Administered 2024-11-03 – 2024-11-04 (×2): 12.5 mg via ORAL
  Filled 2024-11-03 (×2): qty 1

## 2024-11-03 MED ORDER — CHLORHEXIDINE GLUCONATE CLOTH 2 % EX PADS
6.0000 | MEDICATED_PAD | Freq: Once | CUTANEOUS | Status: AC
  Administered 2024-11-03: 6 via TOPICAL

## 2024-11-03 MED ORDER — CITALOPRAM HYDROBROMIDE 20 MG PO TABS: 20.0000 mg | ORAL_TABLET | Freq: Every day | ORAL | Status: DC

## 2024-11-03 MED ORDER — PLASMA-LYTE A IV SOLN
INTRAVENOUS | Status: DC
  Filled 2024-11-03: qty 2.5

## 2024-11-03 MED ORDER — CEFAZOLIN SODIUM-DEXTROSE 2-4 GM/100ML-% IV SOLN
2.0000 g | INTRAVENOUS | Status: DC
  Filled 2024-11-03: qty 100

## 2024-11-03 MED ORDER — DEXMEDETOMIDINE HCL IN NACL 400 MCG/100ML IV SOLN
0.1000 ug/kg/h | INTRAVENOUS | Status: DC
Start: 2024-11-04 — End: 2024-11-03
  Filled 2024-11-03: qty 100

## 2024-11-03 MED ORDER — CEFAZOLIN SODIUM-DEXTROSE 2-4 GM/100ML-% IV SOLN
2.0000 g | INTRAVENOUS | Status: AC
  Administered 2024-11-04: 2 g via INTRAVENOUS
  Filled 2024-11-03: qty 100

## 2024-11-03 MED ORDER — MILRINONE LACTATE IN DEXTROSE 20-5 MG/100ML-% IV SOLN
0.3000 ug/kg/min | INTRAVENOUS | Status: DC
Start: 2024-11-04 — End: 2024-11-05
  Filled 2024-11-03: qty 100

## 2024-11-03 MED ORDER — ASPIRIN 81 MG PO TBEC: 81.0000 mg | DELAYED_RELEASE_TABLET | Freq: Every day | ORAL | Status: DC

## 2024-11-03 MED ORDER — POTASSIUM CHLORIDE 2 MEQ/ML IV SOLN
80.0000 meq | INTRAVENOUS | Status: DC
  Filled 2024-11-03: qty 40

## 2024-11-03 MED ORDER — MANNITOL 20 % IV SOLN
INTRAVENOUS | Status: DC
  Filled 2024-11-03: qty 13

## 2024-11-03 MED ORDER — NITROGLYCERIN IN D5W 200-5 MCG/ML-% IV SOLN
2.0000 ug/min | INTRAVENOUS | Status: DC
Start: 2024-11-04 — End: 2024-11-03
  Filled 2024-11-03: qty 250

## 2024-11-03 MED ORDER — LIDOCAINE HCL (PF) 1 % IJ SOLN
INTRAMUSCULAR | Status: AC
  Filled 2024-11-03: qty 30

## 2024-11-03 MED ORDER — TRANEXAMIC ACID 1000 MG/10ML IV SOLN
1.5000 mg/kg/h | INTRAVENOUS | Status: DC
  Filled 2024-11-03: qty 25

## 2024-11-03 MED ORDER — NOREPINEPHRINE 4 MG/250ML-% IV SOLN
0.0000 ug/min | INTRAVENOUS | Status: DC
  Filled 2024-11-03: qty 250

## 2024-11-03 MED ORDER — CHLORHEXIDINE GLUCONATE CLOTH 2 % EX PADS
6.0000 | MEDICATED_PAD | Freq: Once | CUTANEOUS | Status: AC
  Administered 2024-11-04: 6 via TOPICAL

## 2024-11-03 MED ORDER — TEMAZEPAM 15 MG PO CAPS: 15.0000 mg | ORAL_CAPSULE | Freq: Once | ORAL | Status: DC | PRN

## 2024-11-03 MED ORDER — INSULIN REGULAR(HUMAN) IN NACL 100-0.9 UT/100ML-% IV SOLN
INTRAVENOUS | Status: DC
  Filled 2024-11-03: qty 100

## 2024-11-03 MED ORDER — IOHEXOL 350 MG/ML SOLN
INTRAVENOUS | Status: DC | PRN
  Administered 2024-11-03: 50 mL

## 2024-11-03 MED ORDER — TRANEXAMIC ACID (OHS) BOLUS VIA INFUSION
15.0000 mg/kg | INTRAVENOUS | Status: AC
  Administered 2024-11-04: 1020 mg via INTRAVENOUS
  Filled 2024-11-03: qty 1020

## 2024-11-03 MED ORDER — HYDRALAZINE HCL 20 MG/ML IJ SOLN: 10.0000 mg | INTRAMUSCULAR | Status: AC | PRN

## 2024-11-03 MED ORDER — HEPARIN (PORCINE) IN NACL 1000-0.9 UT/500ML-% IV SOLN
INTRAVENOUS | Status: DC | PRN
  Administered 2024-11-03 (×2): 500 mL

## 2024-11-03 MED ORDER — NITROGLYCERIN IN D5W 200-5 MCG/ML-% IV SOLN
2.0000 ug/min | INTRAVENOUS | Status: DC
Start: 2024-11-04 — End: 2024-11-05
  Filled 2024-11-03: qty 250

## 2024-11-03 MED ORDER — CHLORHEXIDINE GLUCONATE 0.12 % MT SOLN
15.0000 mL | Freq: Once | OROMUCOSAL | Status: AC
  Administered 2024-11-04: 15 mL via OROMUCOSAL
  Filled 2024-11-03: qty 15

## 2024-11-03 MED ORDER — VANCOMYCIN HCL 1250 MG/250ML IV SOLN
1250.0000 mg | INTRAVENOUS | Status: AC
  Administered 2024-11-04: 1250 mg via INTRAVENOUS
  Filled 2024-11-03: qty 250

## 2024-11-03 MED ORDER — FENTANYL CITRATE (PF) 100 MCG/2ML IJ SOLN
INTRAMUSCULAR | Status: AC
  Filled 2024-11-03: qty 2

## 2024-11-03 MED ORDER — BISACODYL 5 MG PO TBEC
5.0000 mg | DELAYED_RELEASE_TABLET | Freq: Once | ORAL | Status: DC
  Filled 2024-11-03: qty 1

## 2024-11-03 MED ORDER — EPINEPHRINE HCL 5 MG/250ML IV SOLN IN NS
0.0000 ug/min | INTRAVENOUS | Status: DC
Start: 2024-11-04 — End: 2024-11-03
  Filled 2024-11-03: qty 250

## 2024-11-03 MED ORDER — SODIUM CHLORIDE 0.9% FLUSH: 3.0000 mL | Freq: Two times a day (BID) | INTRAVENOUS | Status: DC

## 2024-11-03 MED ORDER — HEPARIN SODIUM (PORCINE) 1000 UNIT/ML IJ SOLN
INTRAMUSCULAR | Status: DC | PRN
  Administered 2024-11-03: 3500 [IU] via INTRAVENOUS

## 2024-11-03 MED ORDER — HEPARIN SODIUM (PORCINE) 1000 UNIT/ML IJ SOLN
INTRAMUSCULAR | Status: AC
Start: 2024-11-03 — End: 2024-11-03
  Filled 2024-11-03: qty 10

## 2024-11-03 MED ORDER — LIDOCAINE HCL (PF) 1 % IJ SOLN
INTRAMUSCULAR | Status: DC | PRN
  Administered 2024-11-03: 5 mL via INTRADERMAL

## 2024-11-03 MED ORDER — MELATONIN 3 MG PO TABS
3.0000 mg | ORAL_TABLET | Freq: Every day | ORAL | Status: DC
  Administered 2024-11-03 – 2024-11-07 (×5): 3 mg via ORAL
  Filled 2024-11-03 (×5): qty 1

## 2024-11-03 MED ORDER — NITROGLYCERIN 0.4 MG SL SUBL: 0.4000 mg | SUBLINGUAL_TABLET | SUBLINGUAL | Status: DC | PRN

## 2024-11-03 MED ORDER — FENTANYL CITRATE (PF) 100 MCG/2ML IJ SOLN
INTRAMUSCULAR | Status: DC | PRN
  Administered 2024-11-03: 25 ug via INTRAVENOUS

## 2024-11-03 MED ORDER — MIDAZOLAM HCL (PF) 2 MG/2ML IJ SOLN
INTRAMUSCULAR | Status: DC | PRN
  Administered 2024-11-03: 2 mg via INTRAVENOUS

## 2024-11-03 MED ORDER — VERAPAMIL HCL 2.5 MG/ML IV SOLN
INTRAVENOUS | Status: DC | PRN
  Administered 2024-11-03: 10 mL via INTRA_ARTERIAL

## 2024-11-03 MED ORDER — PHENYLEPHRINE HCL-NACL 20-0.9 MG/250ML-% IV SOLN
30.0000 ug/min | INTRAVENOUS | Status: AC
  Administered 2024-11-04: 25 ug/min via INTRAVENOUS
  Filled 2024-11-03: qty 250

## 2024-11-03 MED ORDER — SODIUM CHLORIDE 0.9 % IV SOLN: 250.0000 mL | INTRAVENOUS | Status: DC | PRN

## 2024-11-03 MED ORDER — TRANEXAMIC ACID (OHS) BOLUS VIA INFUSION
15.0000 mg/kg | INTRAVENOUS | Status: DC
  Filled 2024-11-03: qty 1020

## 2024-11-03 MED ORDER — SODIUM CHLORIDE 0.9% FLUSH: 3.0000 mL | INTRAVENOUS | Status: DC | PRN

## 2024-11-03 MED ORDER — ACETAMINOPHEN 325 MG PO TABS: 650.0000 mg | ORAL_TABLET | ORAL | Status: DC | PRN

## 2024-11-03 MED ORDER — SODIUM CHLORIDE 0.9% FLUSH
3.0000 mL | Freq: Two times a day (BID) | INTRAVENOUS | Status: DC
  Administered 2024-11-03: 3 mL via INTRAVENOUS

## 2024-11-03 MED ORDER — DEXMEDETOMIDINE HCL IN NACL 400 MCG/100ML IV SOLN
0.1000 ug/kg/h | INTRAVENOUS | Status: AC
Start: 2024-11-04 — End: 2024-11-05
  Administered 2024-11-04: .7 ug/kg/h via INTRAVENOUS
  Filled 2024-11-03: qty 100

## 2024-11-03 MED ORDER — NOREPINEPHRINE 4 MG/250ML-% IV SOLN
0.0000 ug/min | INTRAVENOUS | Status: AC
Start: 2024-11-04 — End: 2024-11-05
  Administered 2024-11-04: 2 ug/min via INTRAVENOUS
  Filled 2024-11-03: qty 250

## 2024-11-03 NOTE — Progress Notes (Signed)
 PHARMACY - ANTICOAGULATION CONSULT NOTE  Pharmacy Consult for Heparin Indication: chest pain/ACS  Allergies  Allergen Reactions   Tamsulosin  Shortness Of Breath   Codeine Hives   Penicillins Hives    Patient Measurements: Height: 6' (182.9 cm) Weight: 68 kg (150 lb) IBW/kg (Calculated) : 77.6 HEPARIN DW (KG): 68  Vital Signs: Temp: 97.5 F (36.4 C) (11/06 1013) Temp Source: Oral (11/06 1013) BP: 111/70 (11/06 1600) Pulse Rate: 62 (11/06 1600)  Labs: No results for input(s): HGB, HCT, PLT, APTT, LABPROT, INR, HEPARINUNFRC, HEPRLOWMOCWT, CREATININE, CKTOTAL, CKMB, TROPONINIHS in the last 72 hours.  CrCl cannot be calculated (Patient's most recent lab result is older than the maximum 21 days allowed.).   Medical History: Past Medical History:  Diagnosis Date   Anxiety and depression    Diverticulosis 09/16/2019   GERD (gastroesophageal reflux disease)    occ   History of kidney stones 09/16/2019   Partially obstructing 6 x 3 mm calculus within the mid left ureter causing mild left hydronephrosis   Peyronie disease    Pre-diabetes    Sciatica    left and right greater on right   Seasonal allergies    Wears glasses     Medications:  Scheduled:   aspirin  81 mg Oral Pre-Cath   [START ON 11/04/2024] epinephrine  0-10 mcg/min Intravenous To OR   free water  500 mL Oral Once   [START ON 11/04/2024] heparin sodium (porcine) 2,500 Units, papaverine 30 mg in electrolyte-A (PLASMALYTE-A PH 7.4) 500 mL irrigation   Irrigation To OR   [START ON 11/04/2024] insulin   Intravenous To OR   [START ON 11/04/2024] [place] Blood Cardioplegia vial (lidocaine /magnesium/mannitol 0.26g-4g-6.4g)   Intracoronary To OR   metoprolol tartrate  12.5 mg Oral BID   nitroGLYCERIN       [START ON 11/04/2024] phenylephrine  30-200 mcg/min Intravenous To OR   [START ON 11/04/2024] potassium chloride  80 mEq Other To OR   sodium chloride  flush  3 mL Intravenous Q12H   [START  ON 11/04/2024] tranexamic acid  15 mg/kg Intravenous To OR   [START ON 11/04/2024] tranexamic acid  2 mg/kg Intracatheter To OR   Infusions:   sodium chloride      [START ON 11/04/2024]  ceFAZolin (ANCEF) IV     [START ON 11/04/2024]  ceFAZolin (ANCEF) IV     [START ON 11/04/2024] dexmedetomidine     [START ON 11/04/2024] heparin 30,000 units/NS 1000 mL solution for CELLSAVER     [START ON 11/04/2024] milrinone     [START ON 11/04/2024] nitroGLYCERIN     [START ON 11/04/2024] norepinephrine     [START ON 11/04/2024] tranexamic acid (CYKLOKAPRON) 2,500 mg in sodium chloride  0.9 % 250 mL (10 mg/mL) infusion     [START ON 11/04/2024] vancomycin     PRN: sodium chloride , fentaNYL , Heparin (Porcine) in NaCl, heparin sodium (porcine), iohexol , lidocaine  (PF), midazolam  PF, nitroGLYCERIN, nitroGLYCERIN, Radial Cocktail/Verapamil only, sodium chloride  flush  Assessment: 73 yo male presented for cardiac cath > 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 Pharmacy consulted for IV heparin 2hr post TR band removal. Not on AC PTA.  Goal of Therapy:  Heparin level 0.3-0.7 units/ml Monitor platelets by anticoagulation protocol: Yes   Plan:  2hr after TR band removed, begin IV heparin infusion 1000 units/hr (no bolus) Plans for CABG noted 11/7  Rocky Slade, PharmD, BCPS 11/03/2024,4:27 PM

## 2024-11-03 NOTE — Progress Notes (Incomplete)
 Patient had Vagal episode while attempting to place IV's, heart rate dropped in 40's BP

## 2024-11-03 NOTE — Interval H&P Note (Signed)
 History and Physical Interval Note:  11/03/2024 1:15 PM  [name]  has presented today for surgery, with the diagnosis of chest pain, fatigue, and abnormal cardiac CTA.  The various methods of treatment have been discussed with the patient and family. After consideration of risks, benefits and other options for treatment, the patient has consented to  Procedure(s): LEFT HEART CATH AND CORONARY ANGIOGRAPHY (N/A) as a surgical intervention.  The patient's history has been reviewed, patient examined, no change in status, stable for surgery.  I have reviewed the patient's chart and labs.  Questions were answered to the patient's satisfaction.    Cath Lab Visit (complete for each Cath Lab visit)  Clinical Evaluation Leading to the Procedure:   ACS: No.  Non-ACS:    Anginal Classification: CCS IV  Anti-ischemic medical therapy: No Therapy  Non-Invasive Test Results: Coronary CTA with severe LCx/OM disease -> intermediate risk  Prior CABG: No previous CABG  Jon Terry

## 2024-11-03 NOTE — Progress Notes (Signed)
 TR band off at 1845. Right radial site is clean dry and intact. No bruising, no hematoma, no bleeding, and no swelling at site. Site cleansed with alcohol swab. Tegaderm and gauze dressing placed.

## 2024-11-03 NOTE — Consult Note (Addendum)
 [address]       [place] [address]             [phone]        GER RINGENBERG [facility] Medical Record #[id number] Date of Birth: [birth year]  Referring: Jon Bruckner, MD  Primary Care: Jon Slater, MD Primary Cardiologist: None  Reason for consult:  Evaluation for coronary artery bypass grafting.   History of Present Illness:    [name] is a 73 year old male who is a former smoker having quit in 1991.  He also has a history of anxiety, depression, diverticulosis, and is prediabetic.  He has a positive family history for coronary artery disease.  He recently requested evaluation by cardiology service for palpitations and dyspnea on exertion.  Initial EKG was unremarkable.  Coronary CTA was recommended along with echocardiogram.  The coronary CTA showed moderate LAD stenosis and also had a suggestion of a severe proximal circumflex stenosis.  Echocardiogram showed mild aortic insufficiency and mild mitral insufficiency no aortic stenosis.  Left ventricular ejection fraction was estimated 55 to 60%.  Right ventricular function was normal.  Given the positive findings of the coronary CTA, left heart catheterization was recommended and carried out earlier today.  The study demonstrates a 65% proximal left main stenosis as well as high-grade proximal stenoses in the LAD and circumflex.  [name] had chest pain at rest just prior to the left heart catheterization.  CT surgery has been asked to evaluate the patient for consideration of urgent coronary bypass grafting. [name] is currently resting in the cath lab recovery area and is comfortable. He said the episode of chest discomfort he had prior to the left heart cath was similar to the episodes he has been having for several months. He is retired after Games [employer] for several years. He remains active in caring for his home and doing yard work. He denies any dental issues and his last  dental check up was in September.      Current Activity/ Functional Status: Patient is independent with mobility/ambulation, transfers, ADL's, IADL's.   Zubrod Score: At the time of surgery this patient's most appropriate activity status/level should be described as: []     0    Normal activity, no symptoms [x]     1    Restricted in physical strenuous activity but ambulatory, able to do out light work []     2    Ambulatory and capable of self care, unable to do work activities, up and about                 more than 50%  Of the time                            []     3    Only limited self care, in bed greater than 50% of waking hours []     4    Completely disabled, no self care, confined to bed or chair []     5    Moribund  Past Medical History:  Diagnosis Date   Anxiety and depression    Diverticulosis 09/16/2019   GERD (gastroesophageal reflux disease)    occ   History of kidney stones 09/16/2019   Partially obstructing 6 x 3 mm calculus within the mid left ureter causing mild left hydronephrosis   Peyronie disease    Pre-diabetes  Sciatica    left and right greater on right   Seasonal allergies    Wears glasses     Past Surgical History:  Procedure Laterality Date   COLONOSCOPY     x2   CYSTOSCOPY/URETEROSCOPY/HOLMIUM LASER/STENT PLACEMENT Left 09/21/2019   Procedure: CYSTOSCOPY/RETROGRADE/URETEROSCOPY/HOLMIUM LASER/STENT PLACEMENT;  Surgeon: Devere Lonni Righter, MD;  Location: [facility];  Service: Urology;  Laterality: Left;  ONLY NEEDS 45 MIN   HERNIA REPAIR  2010   right inguinal   WISDOM TOOTH EXTRACTION      Social History   Tobacco Use  Smoking Status Former   Current packs/day: 0.00   Average packs/day: 0.3 packs/day for 20.0 years (5.0 ttl pk-yrs)   Types: Cigarettes   Start date: 40   Quit date: 52   Years since quitting: 34.8  Smokeless Tobacco Never  Tobacco Comments   off and on    Social History   Substance and  Sexual Activity  Alcohol Use Yes   Comment: 5 drinks per week     Allergies  Allergen Reactions   Tamsulosin  Shortness Of Breath   Codeine Hives   Penicillins Hives    Current Facility-Administered Medications  Medication Dose Route Frequency Provider Last Rate Last Admin   0.9 %  sodium chloride  infusion  250 mL Intravenous PRN Goodrich, Callie E, PA-C       aspirin chewable tablet 81 mg  81 mg Oral Pre-Cath Goodrich, Callie E, PA-C       fentaNYL  (SUBLIMAZE ) injection    PRN End, Lonni, MD   25 mcg at 11/03/24 1331   free water 500 mL  500 mL Oral Once Goodrich, Callie E, PA-C       Heparin (Porcine) in NaCl 1000-0.9 UT/500ML-% SOLN    PRN End, Lonni, MD   500 mL at 11/03/24 1351   heparin sodium (porcine) injection    PRN End, Lonni, MD   3,500 Units at 11/03/24 1338   iohexol  (OMNIPAQUE ) 350 MG/ML injection    PRN End, Lonni, MD   50 mL at 11/03/24 1359   lidocaine  (PF) (XYLOCAINE ) 1 % injection    PRN End, Lonni, MD   5 mL at 11/03/24 1336   metoprolol tartrate (LOPRESSOR) tablet 12.5 mg  12.5 mg Oral BID End, Lonni, MD       midazolam  PF (VERSED ) injection    PRN End, Lonni, MD   2 mg at 11/03/24 1331   nitroGLYCERIN 1 mg/10 mL (100 mcg/mL) - IR/CATH LAB    PRN End, Lonni, MD   200 mcg at 11/03/24 1346   nitroGLYCERIN 100 mcg/mL intra-arterial injection            Radial Cocktail/Verapamil only    PRN End, Lonni, MD   10 mL at 11/03/24 1336   sodium chloride  flush (NS) 0.9 % injection 3 mL  3 mL Intravenous Q12H Goodrich, Callie E, PA-C       sodium chloride  flush (NS) 0.9 % injection 3 mL  3 mL Intravenous PRN Goodrich, Callie E, PA-C        Medications Prior to Admission  Medication Sig Dispense Refill Last Dose/Taking   aspirin EC 81 MG tablet Take 1 tablet (81 mg total) by mouth daily. Swallow whole.   11/03/2024 at  9:00 AM   citalopram (CELEXA) 20 MG tablet Take 20 mg by mouth daily.   11/03/2024   fluticasone  (FLONASE) 50 MCG/ACT nasal spray Place 1 spray into both nostrils daily.  11/03/2024   nitroGLYCERIN (NITROSTAT) 0.4 MG SL tablet Place 1 tablet (0.4 mg total) under the tongue every 5 (five) minutes as needed for chest pain. 25 tablet 2 Past Month   Polyethyl Glyc-Propyl Glyc PF (SYSTANE ULTRA PF) 0.4-0.3 % SOLN Place 1 drop into both eyes daily as needed (Dry eyes).   Past Week   rosuvastatin (CRESTOR) 20 MG tablet Take 1 tablet (20 mg total) by mouth daily. 30 tablet 2 11/03/2024    Family History  Problem Relation Age of Onset   Prostate cancer Neg Hx    Kidney cancer Neg Hx      Review of Systems:       Cardiac Review of Systems: Y or  [    ]= no  Chest Pain [  x  ]  Resting SOB [   ] Exertional SOB  [ x ]  Orthopnea [  ]   Pedal Edema [   ]    Palpitations [x  ] Syncope  [  ]   Presyncope [   ]  General Review of Systems: [Y] = yes [  ]=no Constitional: recent weight change [  ]; anorexia [  ]; fatigue [  ]; nausea [  ]; night sweats [  ]; fever [  ]; or chills [  ]                                                               Dental: Last Dentist visit: 08/2024  Eye : blurred vision [  ]; diplopia [   ]; vision changes [  ];  Amaurosis fugax[  ]; Resp: cough [  ];  wheezing[  ];  hemoptysis[  ]; shortness of breath[  ]; paroxysmal nocturnal dyspnea[  ]; dyspnea on exertion[ x ]; or orthopnea[  ];  GI:  gallstones[  ], vomiting[  ];  dysphagia[  ]; melena[  ];  hematochezia [  ]; heartburn[  ];   Hx of  Colonoscopy[  ]; GU: kidney stones [  ]; hematuria[  ];   dysuria [  ];  nocturia[  ];  history of     obstruction [  ]; urinary frequency [  ]             Skin: rash, swelling[  ];, hair loss[  ];  peripheral edema[  ];  or itching[  ]; Musculosketetal: myalgias[  ];  joint swelling[  ];  joint erythema[  ];  joint pain[  ];  back pain[  ];  Heme/Lymph: bruising[  ];  bleeding[  ];  anemia[  ];  Neuro: TIA[  ];  headaches[  ];  stroke[  ];  vertigo[  ];  seizures[  ];    paresthesias[  ];  difficulty walking[  ];  Psych:depression[  ]; anxiety[  ];  Endocrine: diabetes[  ];  thyroid dysfunction[  ];         Physical Exam: BP 91/67   Pulse 68   Temp (!) 97.5 F (36.4 C) (Oral)   Resp 14   Ht 6' (1.829 m)   Wt 68 kg   SpO2 94%   BMI 20.34 kg/m    General appearance: alert, cooperative, and no distress Head: Normocephalic, without obvious abnormality, atraumatic Neck: no  adenopathy, no carotid bruit, no JVD, and supple, symmetrical, trachea midline Resp: clear to auscultation bilaterally Cardio: RRR, no murmur GI: soft, no tenderness or mass Extremities: no deformities. All well perfused with palpable distal pulses. No significant LE varicosities. Neurologic: Grossly normal  Diagnostic Studies & Laboratory data:     Recent Radiology Findings:    LEFT HEART CATH AND CORONARY ANGIOGRAPHY   Conclusion  Conclusions: Severe left coronary artery disease, including 60-70% ostial LMCA stenosis (pressure dampening noted with 33F diagnostic catheter), sequential mid LAD stenoses of up to 70-80%, and diffuse disease of the ostial through mid LCx with up to 90% stenosis.  The right coronary artery is without significant disease. Low left ventricular filling pressure (LVEDP 3 mmHg - measured after patient had received intracoronary nitroglycerin).   Recommendations: Given chest pain at rest prior to catheterization today, we will admit Mr. Pester for expedited cardiac surgery consultation for CABG. Initiate heparin infusion 2 hours after TR band has been removed in the setting of unstable angina and severe coronary artery disease. Aggressive secondary prevention of coronary artery disease.   Lonni Hanson, MD [facility]   Coronary Findings  Diagnostic Dominance: Right Left Main  Vessel is moderate in size.  Ost LM lesion is 65% stenosed. Pressure dampening noted with 33F diagnostic catheter.    Left Anterior Descending  Mid LAD-1 lesion  is 75% stenosed.  Mid LAD-2 lesion is 40% stenosed.    First Diagonal Branch  Vessel is moderate in size.    Second Diagonal Branch  Vessel is small in size.    Third Diagonal Branch  Vessel is small in size.    Left Circumflex  Vessel is moderate in size.  Ost Cx to Mid Cx lesion is 50% stenosed.  Mid Cx lesion is 90% stenosed.    First Obtuse Marginal Branch  Vessel is small in size.    Second Obtuse Marginal Branch  Vessel is moderate in size.    Third Obtuse Marginal Branch  Vessel is moderate in size.    Right Coronary Artery  Vessel is large. Vessel is angiographically normal.    Right Posterior Descending Artery  Vessel is moderate in size.    Right Posterior Atrioventricular Artery  Vessel is moderate in size.    First Right Posterolateral Branch  Vessel is small in size.    Second Right Posterolateral Branch  Vessel is small in size.    Third Right Posterolateral Branch  Vessel is moderate in size.    Intervention   No interventions have been documented.   Left Heart  Left Ventricle LV end diastolic pressure is low. LVEDP 3 mmHg (after receiving intracoronary nitroglycerin).  Aortic Valve There is no aortic valve stenosis.   Coronary Diagrams  Diagnostic Dominance: Right     EHOCARDIOGRAM REPORT       Patient Name:   Jon Terry Date of Exam: 11/01/2024 Medical Rec #:  [id number]       Height:       72.0 in Accession #:    [id number]      Weight:       151.0 lb Date of Birth:  08/08/1951      BSA:          1.891 m Patient Age:    72 years        BP:           122/74 mmHg Patient Gender: M  HR:           47 bpm. Exam Location:  [address]  Procedure: 2D Echo, 3D Echo, Cardiac Doppler, Color Doppler and Strain Analysis            (Both Spectral and Color Flow Doppler were utilized during            procedure).  Indications:    R06.00 Dyspnea                 R07.9 Chest pain   History:        Patient has no prior  history of Echocardiogram examinations.   Sonographer:    Carl Coma RDCS Referring Phys: [id number] CALLIE E GOODRICH  IMPRESSIONS    1. Left ventricular ejection fraction, by estimation, is 55 to 60%. Left ventricular ejection fraction by 3D volume is 58 %. The left ventricle has normal function. The left ventricle has no regional wall motion abnormalities. Left ventricular diastolic  parameters were normal. The average left ventricular global longitudinal strain is -24.2 %. The global longitudinal strain is normal.  2. Right ventricular systolic function is normal. The right ventricular size is mildly enlarged. There is normal pulmonary artery systolic pressure. The estimated right ventricular systolic pressure is 17.9 mmHg.  3. The mitral valve is normal in structure. Mild mitral valve regurgitation. No evidence of mitral stenosis.  4. The aortic valve is normal in structure. Aortic valve regurgitation is mild. Aortic valve sclerosis/calcification is present, without any evidence of aortic stenosis. Aortic regurgitation PHT measures 931 msec.  5. The inferior vena cava is normal in size with greater than 50% respiratory variability, suggesting right atrial pressure of 3 mmHg.  FINDINGS  Left Ventricle: Left ventricular ejection fraction, by estimation, is 55 to 60%. Left ventricular ejection fraction by 3D volume is 58 %. The left ventricle has normal function. The left ventricle has no regional wall motion abnormalities. The average left ventricular global longitudinal strain is -24.2 %. Strain was performed and the global longitudinal strain is normal. The left ventricular internal cavity size was normal in size. There is no left ventricular hypertrophy. Left ventricular diastolic parameters were normal. Normal left ventricular filling pressure.  Right Ventricle: The right ventricular size is mildly enlarged. No increase in right ventricular wall thickness. Right  ventricular systolic function is normal. There is normal pulmonary artery systolic pressure. The tricuspid regurgitant velocity is 1.93  m/s, and with an assumed right atrial pressure of 3 mmHg, the estimated right ventricular systolic pressure is 17.9 mmHg.  Left Atrium: Left atrial size was normal in size.  Right Atrium: Right atrial size was normal in size.  Pericardium: There is no evidence of pericardial effusion.  Mitral Valve: The mitral valve is normal in structure. Mild mitral valve regurgitation. No evidence of mitral valve stenosis.  Tricuspid Valve: The tricuspid valve is normal in structure. Tricuspid valve regurgitation is mild . No evidence of tricuspid stenosis.  Aortic Valve: The aortic valve is normal in structure. Aortic valve regurgitation is mild. Aortic regurgitation PHT measures 931 msec. Aortic valve sclerosis/calcification is present, without any evidence of aortic stenosis.  Pulmonic Valve: The pulmonic valve was normal in structure. Pulmonic valve regurgitation is not visualized. No evidence of pulmonic stenosis.  Aorta: The aortic root is normal in size and structure.  Venous: The inferior vena cava is normal in size with greater than 50% respiratory variability, suggesting right atrial pressure of 3 mmHg.  IAS/Shunts: No atrial level shunt detected by  color flow Doppler.  Additional Comments: 3D was performed not requiring image post processing on an independent workstation and was normal.    LEFT VENTRICLE PLAX 2D LVIDd:         4.50 cm         Diastology LVIDs:         3.10 cm         LV e' medial:    8.98 cm/s LV PW:         1.00 cm         LV E/e' medial:  7.2 LV IVS:        1.00 cm         LV e' lateral:   12.15 cm/s LVOT diam:     2.00 cm         LV E/e' lateral: 5.3 LV SV:         64 LV SV Index:   34              2D Longitudinal LVOT Area:     3.14 cm        Strain                                2D Strain GLS   -24.4 %                                 (A4C):                                2D Strain GLS   -23.8 %                                (A3C):                                2D Strain GLS   -24.4 %                                (A2C):                                2D Strain GLS   -24.2 %                                Avg:                                  3D Volume EF                                LV 3D EF:    Left                                             ventricul  ar                                             ejection                                             fraction                                             by 3D                                             volume is                                             58 %.                                  3D Volume EF:                                3D EF:        58 %                                LV EDV:       117 ml                                LV ESV:       49 ml                                LV SV:        68 ml  RIGHT VENTRICLE             IVC RV Basal diam:  4.30 cm     IVC diam: 1.90 cm RV S prime:     12.95 cm/s TAPSE (M-mode): 2.8 cm  LEFT ATRIUM             Index        RIGHT ATRIUM           Index LA diam:        3.80 cm 2.01 cm/m   RA Area:     16.10 cm LA Vol (A2C):   35.3 ml 18.67 ml/m  RA Volume:   50.00 ml  26.45 ml/m LA Vol (A4C):   49.7 ml 26.29 ml/m LA Biplane Vol: 45.9 ml 24.28 ml/m  AORTIC VALVE LVOT Vmax:   80.70 cm/s LVOT Vmean:  52.900 cm/s LVOT VTI:    0.204 m AI PHT:      931 msec  AORTA Ao Root diam: 3.50 cm Ao Asc diam:  3.40 cm  MITRAL VALVE               TRICUSPID VALVE MV Area (PHT): 2.60 cm    TR Peak grad:   14.9 mmHg MV Decel Time: 292 msec    TR Vmax:        193.00 cm/s MV E velocity: 65.00 cm/s MV A velocity: 54.50 cm/s  SHUNTS MV E/A ratio:  1.19        Systemic VTI:  0.20 m                            Systemic Diam: 2.00 cm  Wilbert Bihari  MD Electronically signed by Wilbert Bihari MD Signature Date/Time: 11/01/2024/3:53:56 PM       Final     Imaging Info    I have independently reviewed the above radiologic studies and discussed with the patient   Recent Lab Findings: Lab Results  Component Value Date   WBC 5.2 10/31/2024   HGB 14.6 10/31/2024   HCT 44.4 10/31/2024   PLT 193 10/31/2024   GLUCOSE 76 10/12/2024   ALT 18 09/16/2019   AST 26 09/16/2019   NA 138 10/12/2024   K 4.2 10/12/2024   CL 100 10/12/2024   CREATININE 0.87 10/12/2024   BUN 16 10/12/2024   CO2 24 10/12/2024      Assessment / Plan:      -CAD:  Left main and 2-vessel coronary artery disease with an episode of angina while at rest just prior to the left heart cath today.  He has preserved biventricular function and no significant valvular disease.  Coronary bypass grafting is his best option for revascularization.  The procedure and expected perioperative course were discussed with Mr. Wolfley and his questions were answered.  He would like for us  to proceed with pre-op planning for potential surgery tomorrow morning by Dr. Daniel.   -Pre-diabetes: managed with diet prior to admission. HgbA1C pending.   -Dyslipidemia:  recently prescribed rosovustatin but he has not yet started it.   -History of anxiety and depression: on Celexa 20mg  daily PTA.     I  spent 25 minutes counseling the patient face to face.   Laurel JUDITHANN Becket, PA-C  11/03/2024 3:06 PM  Mr. Cull is a very pleasant 73 year old man who presents after LHC demonstrated multivessel CAD. He has been having chest pain at rest for several months, including this morning right before LHC.  He is very active and independent in his ADLs. 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  He has a 6-7 pack year smoking history and drinks 2-3 beers a day.  He has no prior chest surgery, no radiation and does not take blood thinners.  He is a good candidate for CABG so  we will proceed tomorrow with surgery.  I discussed the general nature of the procedure, including the need for general anesthesia, the incisions to be used, the use of cardiopulmonary bypass, and the use of temporary pacemaker wires and drainage tubes postoperatively with the patient and his wife.  We discussed the expected hospital stay, overall recovery and short and long term outcomes. I informed them of the indications, risks, benefits and alternatives.   They understands the risks include, but are not limited to death, stroke, MI, DVT/PE, bleeding, possible need for transfusion, infections, cardiac arrhythmias, as well as other  organ system dysfunction including respiratory (eg: prolonged ventilation), renal, or GI complications.   Con Clunes, MD Cardiothoracic Surgery Pager: [phone]

## 2024-11-04 ENCOUNTER — Inpatient Hospital Stay (HOSPITAL_COMMUNITY)

## 2024-11-04 ENCOUNTER — Encounter (HOSPITAL_COMMUNITY): Payer: Self-pay | Admitting: Internal Medicine

## 2024-11-04 ENCOUNTER — Encounter (HOSPITAL_COMMUNITY): Admitting: Critical Care Medicine

## 2024-11-04 ENCOUNTER — Inpatient Hospital Stay (HOSPITAL_COMMUNITY): Admission: AD | Disposition: A | Payer: Self-pay | Source: Home / Self Care | Attending: Internal Medicine

## 2024-11-04 ENCOUNTER — Inpatient Hospital Stay (HOSPITAL_COMMUNITY): Admitting: Critical Care Medicine

## 2024-11-04 DIAGNOSIS — Z951 Presence of aortocoronary bypass graft: Secondary | ICD-10-CM

## 2024-11-04 DIAGNOSIS — F419 Anxiety disorder, unspecified: Secondary | ICD-10-CM | POA: Diagnosis not present

## 2024-11-04 DIAGNOSIS — E785 Hyperlipidemia, unspecified: Secondary | ICD-10-CM | POA: Diagnosis not present

## 2024-11-04 DIAGNOSIS — J9811 Atelectasis: Secondary | ICD-10-CM | POA: Diagnosis not present

## 2024-11-04 DIAGNOSIS — D696 Thrombocytopenia, unspecified: Secondary | ICD-10-CM | POA: Diagnosis not present

## 2024-11-04 DIAGNOSIS — I2510 Atherosclerotic heart disease of native coronary artery without angina pectoris: Secondary | ICD-10-CM

## 2024-11-04 DIAGNOSIS — R7303 Prediabetes: Secondary | ICD-10-CM | POA: Diagnosis not present

## 2024-11-04 DIAGNOSIS — I25110 Atherosclerotic heart disease of native coronary artery with unstable angina pectoris: Secondary | ICD-10-CM | POA: Diagnosis not present

## 2024-11-04 DIAGNOSIS — K219 Gastro-esophageal reflux disease without esophagitis: Secondary | ICD-10-CM | POA: Diagnosis not present

## 2024-11-04 DIAGNOSIS — D62 Acute posthemorrhagic anemia: Secondary | ICD-10-CM | POA: Diagnosis not present

## 2024-11-04 DIAGNOSIS — Z87891 Personal history of nicotine dependence: Secondary | ICD-10-CM

## 2024-11-04 DIAGNOSIS — D689 Coagulation defect, unspecified: Secondary | ICD-10-CM | POA: Diagnosis not present

## 2024-11-04 DIAGNOSIS — F32A Depression, unspecified: Secondary | ICD-10-CM | POA: Diagnosis not present

## 2024-11-04 DIAGNOSIS — Z7982 Long term (current) use of aspirin: Secondary | ICD-10-CM | POA: Diagnosis not present

## 2024-11-04 DIAGNOSIS — R739 Hyperglycemia, unspecified: Secondary | ICD-10-CM | POA: Diagnosis not present

## 2024-11-04 DIAGNOSIS — E873 Alkalosis: Secondary | ICD-10-CM | POA: Diagnosis not present

## 2024-11-04 DIAGNOSIS — G2581 Restless legs syndrome: Secondary | ICD-10-CM | POA: Diagnosis not present

## 2024-11-04 HISTORY — PX: INTRAOPERATIVE TRANSESOPHAGEAL ECHOCARDIOGRAM: SHX5062

## 2024-11-04 HISTORY — PX: CORONARY ARTERY BYPASS GRAFT: SHX141

## 2024-11-04 LAB — GLUCOSE, CAPILLARY
Glucose-Capillary: 113 mg/dL — ABNORMAL HIGH (ref 70–99)
Glucose-Capillary: 109 mg/dL — ABNORMAL HIGH (ref 70–99)
Glucose-Capillary: 125 mg/dL — ABNORMAL HIGH (ref 70–99)
Glucose-Capillary: 131 mg/dL — ABNORMAL HIGH (ref 70–99)
Glucose-Capillary: 161 mg/dL — ABNORMAL HIGH (ref 70–99)
Glucose-Capillary: 167 mg/dL — ABNORMAL HIGH (ref 70–99)
Glucose-Capillary: 127 mg/dL — ABNORMAL HIGH (ref 70–99)
Glucose-Capillary: 137 mg/dL — ABNORMAL HIGH (ref 70–99)
Glucose-Capillary: 110 mg/dL — ABNORMAL HIGH (ref 70–99)

## 2024-11-04 LAB — HEMOGLOBIN AND HEMATOCRIT, BLOOD
HCT: 28 % — ABNORMAL LOW (ref 39.0–52.0)
Hemoglobin: 9.4 g/dL — ABNORMAL LOW (ref 13.0–17.0)

## 2024-11-04 LAB — POCT I-STAT 7, (LYTES, BLD GAS, ICA,H+H)
Acid-base deficit: 1 mmol/L (ref 0.0–2.0)
Acid-base deficit: 6 mmol/L — ABNORMAL HIGH (ref 0.0–2.0)
Acid-base deficit: 2 mmol/L (ref 0.0–2.0)
Acid-base deficit: 4 mmol/L — ABNORMAL HIGH (ref 0.0–2.0)
Acid-base deficit: 5 mmol/L — ABNORMAL HIGH (ref 0.0–2.0)
Acid-base deficit: 7 mmol/L — ABNORMAL HIGH (ref 0.0–2.0)
Acid-base deficit: 6 mmol/L — ABNORMAL HIGH (ref 0.0–2.0)
Acid-base deficit: 1 mmol/L (ref 0.0–2.0)
Bicarbonate: 22.2 mmol/L (ref 20.0–28.0)
Bicarbonate: 18.8 mmol/L — ABNORMAL LOW (ref 20.0–28.0)
Bicarbonate: 20.7 mmol/L (ref 20.0–28.0)
Bicarbonate: 22.4 mmol/L (ref 20.0–28.0)
Bicarbonate: 16.7 mmol/L — ABNORMAL LOW (ref 20.0–28.0)
Bicarbonate: 17.7 mmol/L — ABNORMAL LOW (ref 20.0–28.0)
Bicarbonate: 19.4 mmol/L — ABNORMAL LOW (ref 20.0–28.0)
Bicarbonate: 24.8 mmol/L (ref 20.0–28.0)
Calcium, Ion: 1.25 mmol/L (ref 1.15–1.40)
Calcium, Ion: 0.92 mmol/L — ABNORMAL LOW (ref 1.15–1.40)
Calcium, Ion: 1.12 mmol/L — ABNORMAL LOW (ref 1.15–1.40)
Calcium, Ion: 1.19 mmol/L (ref 1.15–1.40)
Calcium, Ion: 1.05 mmol/L — ABNORMAL LOW (ref 1.15–1.40)
Calcium, Ion: 1.06 mmol/L — ABNORMAL LOW (ref 1.15–1.40)
Calcium, Ion: 1.1 mmol/L — ABNORMAL LOW (ref 1.15–1.40)
Calcium, Ion: 1.12 mmol/L — ABNORMAL LOW (ref 1.15–1.40)
HCT: 37 % — ABNORMAL LOW (ref 39.0–52.0)
HCT: 27 % — ABNORMAL LOW (ref 39.0–52.0)
HCT: 25 % — ABNORMAL LOW (ref 39.0–52.0)
HCT: 26 % — ABNORMAL LOW (ref 39.0–52.0)
HCT: 20 % — ABNORMAL LOW (ref 39.0–52.0)
HCT: 21 % — ABNORMAL LOW (ref 39.0–52.0)
HCT: 19 % — ABNORMAL LOW (ref 39.0–52.0)
HCT: 18 % — ABNORMAL LOW (ref 39.0–52.0)
Hemoglobin: 12.6 g/dL — ABNORMAL LOW (ref 13.0–17.0)
Hemoglobin: 9.2 g/dL — ABNORMAL LOW (ref 13.0–17.0)
Hemoglobin: 8.5 g/dL — ABNORMAL LOW (ref 13.0–17.0)
Hemoglobin: 8.8 g/dL — ABNORMAL LOW (ref 13.0–17.0)
Hemoglobin: 6.8 g/dL — CL (ref 13.0–17.0)
Hemoglobin: 7.1 g/dL — ABNORMAL LOW (ref 13.0–17.0)
Hemoglobin: 6.5 g/dL — CL (ref 13.0–17.0)
Hemoglobin: 6.1 g/dL — CL (ref 13.0–17.0)
O2 Saturation: 100 %
O2 Saturation: 100 %
O2 Saturation: 100 %
O2 Saturation: 100 %
O2 Saturation: 100 %
O2 Saturation: 99 %
O2 Saturation: 99 %
O2 Saturation: 98 %
Patient temperature: 34.9
Patient temperature: 35.3
Patient temperature: 37
Patient temperature: 37
Potassium: 3.8 mmol/L (ref 3.5–5.1)
Potassium: 4.6 mmol/L (ref 3.5–5.1)
Potassium: 3.7 mmol/L (ref 3.5–5.1)
Potassium: 4.1 mmol/L (ref 3.5–5.1)
Potassium: 3.3 mmol/L — ABNORMAL LOW (ref 3.5–5.1)
Potassium: 3.4 mmol/L — ABNORMAL LOW (ref 3.5–5.1)
Potassium: 3.9 mmol/L (ref 3.5–5.1)
Potassium: 3.9 mmol/L (ref 3.5–5.1)
Sodium: 140 mmol/L (ref 135–145)
Sodium: 140 mmol/L (ref 135–145)
Sodium: 143 mmol/L (ref 135–145)
Sodium: 147 mmol/L — ABNORMAL HIGH (ref 135–145)
Sodium: 145 mmol/L (ref 135–145)
Sodium: 149 mmol/L — ABNORMAL HIGH (ref 135–145)
Sodium: 148 mmol/L — ABNORMAL HIGH (ref 135–145)
Sodium: 147 mmol/L — ABNORMAL HIGH (ref 135–145)
TCO2: 23 mmol/L (ref 22–32)
TCO2: 20 mmol/L — ABNORMAL LOW (ref 22–32)
TCO2: 22 mmol/L (ref 22–32)
TCO2: 23 mmol/L (ref 22–32)
TCO2: 17 mmol/L — ABNORMAL LOW (ref 22–32)
TCO2: 18 mmol/L — ABNORMAL LOW (ref 22–32)
TCO2: 21 mmol/L — ABNORMAL LOW (ref 22–32)
TCO2: 26 mmol/L (ref 22–32)
pCO2 arterial: 33.3 mmHg (ref 32–48)
pCO2 arterial: 34.7 mmHg (ref 32–48)
pCO2 arterial: 34 mmHg (ref 32–48)
pCO2 arterial: 35.4 mmHg (ref 32–48)
pCO2 arterial: 22 mmHg — ABNORMAL LOW (ref 32–48)
pCO2 arterial: 23.2 mmHg — ABNORMAL LOW (ref 32–48)
pCO2 arterial: 38.5 mmHg (ref 32–48)
pCO2 arterial: 48.2 mmHg — ABNORMAL HIGH (ref 32–48)
pH, Arterial: 7.432 (ref 7.35–7.45)
pH, Arterial: 7.341 — ABNORMAL LOW (ref 7.35–7.45)
pH, Arterial: 7.392 (ref 7.35–7.45)
pH, Arterial: 7.408 (ref 7.35–7.45)
pH, Arterial: 7.458 — ABNORMAL HIGH (ref 7.35–7.45)
pH, Arterial: 7.507 — ABNORMAL HIGH (ref 7.35–7.45)
pH, Arterial: 7.31 — ABNORMAL LOW (ref 7.35–7.45)
pH, Arterial: 7.319 — ABNORMAL LOW (ref 7.35–7.45)
pO2, Arterial: 335 mmHg — ABNORMAL HIGH (ref 83–108)
pO2, Arterial: 388 mmHg — ABNORMAL HIGH (ref 83–108)
pO2, Arterial: 427 mmHg — ABNORMAL HIGH (ref 83–108)
pO2, Arterial: 438 mmHg — ABNORMAL HIGH (ref 83–108)
pO2, Arterial: 137 mmHg — ABNORMAL HIGH (ref 83–108)
pO2, Arterial: 166 mmHg — ABNORMAL HIGH (ref 83–108)
pO2, Arterial: 149 mmHg — ABNORMAL HIGH (ref 83–108)
pO2, Arterial: 121 mmHg — ABNORMAL HIGH (ref 83–108)

## 2024-11-04 LAB — POCT I-STAT, CHEM 8
BUN: 10 mg/dL (ref 8–23)
BUN: 10 mg/dL (ref 8–23)
BUN: 8 mg/dL (ref 8–23)
BUN: 8 mg/dL (ref 8–23)
BUN: 7 mg/dL — ABNORMAL LOW (ref 8–23)
Calcium, Ion: 1.22 mmol/L (ref 1.15–1.40)
Calcium, Ion: 1.29 mmol/L (ref 1.15–1.40)
Calcium, Ion: 1.02 mmol/L — ABNORMAL LOW (ref 1.15–1.40)
Calcium, Ion: 0.97 mmol/L — ABNORMAL LOW (ref 1.15–1.40)
Calcium, Ion: 1.21 mmol/L (ref 1.15–1.40)
Chloride: 105 mmol/L (ref 98–111)
Chloride: 104 mmol/L (ref 98–111)
Chloride: 103 mmol/L (ref 98–111)
Chloride: 106 mmol/L (ref 98–111)
Chloride: 108 mmol/L (ref 98–111)
Creatinine, Ser: 0.8 mg/dL (ref 0.61–1.24)
Creatinine, Ser: 0.7 mg/dL (ref 0.61–1.24)
Creatinine, Ser: 0.6 mg/dL — ABNORMAL LOW (ref 0.61–1.24)
Creatinine, Ser: 0.6 mg/dL — ABNORMAL LOW (ref 0.61–1.24)
Creatinine, Ser: 0.6 mg/dL — ABNORMAL LOW (ref 0.61–1.24)
Glucose, Bld: 114 mg/dL — ABNORMAL HIGH (ref 70–99)
Glucose, Bld: 128 mg/dL — ABNORMAL HIGH (ref 70–99)
Glucose, Bld: 107 mg/dL — ABNORMAL HIGH (ref 70–99)
Glucose, Bld: 128 mg/dL — ABNORMAL HIGH (ref 70–99)
Glucose, Bld: 131 mg/dL — ABNORMAL HIGH (ref 70–99)
HCT: 38 % — ABNORMAL LOW (ref 39.0–52.0)
HCT: 37 % — ABNORMAL LOW (ref 39.0–52.0)
HCT: 29 % — ABNORMAL LOW (ref 39.0–52.0)
HCT: 26 % — ABNORMAL LOW (ref 39.0–52.0)
HCT: 26 % — ABNORMAL LOW (ref 39.0–52.0)
Hemoglobin: 12.9 g/dL — ABNORMAL LOW (ref 13.0–17.0)
Hemoglobin: 12.6 g/dL — ABNORMAL LOW (ref 13.0–17.0)
Hemoglobin: 9.9 g/dL — ABNORMAL LOW (ref 13.0–17.0)
Hemoglobin: 8.8 g/dL — ABNORMAL LOW (ref 13.0–17.0)
Hemoglobin: 8.8 g/dL — ABNORMAL LOW (ref 13.0–17.0)
Potassium: 3.8 mmol/L (ref 3.5–5.1)
Potassium: 3.7 mmol/L (ref 3.5–5.1)
Potassium: 3.7 mmol/L (ref 3.5–5.1)
Potassium: 4.9 mmol/L (ref 3.5–5.1)
Potassium: 4.2 mmol/L (ref 3.5–5.1)
Sodium: 140 mmol/L (ref 135–145)
Sodium: 140 mmol/L (ref 135–145)
Sodium: 145 mmol/L (ref 135–145)
Sodium: 141 mmol/L (ref 135–145)
Sodium: 143 mmol/L (ref 135–145)
TCO2: 23 mmol/L (ref 22–32)
TCO2: 26 mmol/L (ref 22–32)
TCO2: 29 mmol/L (ref 22–32)
TCO2: 27 mmol/L (ref 22–32)
TCO2: 25 mmol/L (ref 22–32)

## 2024-11-04 LAB — CBC
HCT: 41.4 % (ref 39.0–52.0)
HCT: 27.4 % — ABNORMAL LOW (ref 39.0–52.0)
HCT: 24.5 % — ABNORMAL LOW (ref 39.0–52.0)
HCT: 20.3 % — ABNORMAL LOW (ref 39.0–52.0)
Hemoglobin: 13.9 g/dL (ref 13.0–17.0)
Hemoglobin: 9.4 g/dL — ABNORMAL LOW (ref 13.0–17.0)
Hemoglobin: 8.2 g/dL — ABNORMAL LOW (ref 13.0–17.0)
Hemoglobin: 6.8 g/dL — CL (ref 13.0–17.0)
MCH: 30.2 pg (ref 26.0–34.0)
MCH: 30.9 pg (ref 26.0–34.0)
MCH: 30.4 pg (ref 26.0–34.0)
MCH: 31.1 pg (ref 26.0–34.0)
MCHC: 33.6 g/dL (ref 30.0–36.0)
MCHC: 34.3 g/dL (ref 30.0–36.0)
MCHC: 33.5 g/dL (ref 30.0–36.0)
MCHC: 33.5 g/dL (ref 30.0–36.0)
MCV: 90 fL (ref 80.0–100.0)
MCV: 90.1 fL (ref 80.0–100.0)
MCV: 90.7 fL (ref 80.0–100.0)
MCV: 92.7 fL (ref 80.0–100.0)
Platelets: 177 K/uL (ref 150–400)
Platelets: 107 K/uL — ABNORMAL LOW (ref 150–400)
Platelets: 105 K/uL — ABNORMAL LOW (ref 150–400)
Platelets: 123 K/uL — ABNORMAL LOW (ref 150–400)
RBC: 4.6 MIL/uL (ref 4.22–5.81)
RBC: 3.04 MIL/uL — ABNORMAL LOW (ref 4.22–5.81)
RBC: 2.7 MIL/uL — ABNORMAL LOW (ref 4.22–5.81)
RBC: 2.19 MIL/uL — ABNORMAL LOW (ref 4.22–5.81)
RDW: 13.3 % (ref 11.5–15.5)
RDW: 13.3 % (ref 11.5–15.5)
RDW: 13.5 % (ref 11.5–15.5)
RDW: 13.6 % (ref 11.5–15.5)
WBC: 8.3 K/uL (ref 4.0–10.5)
WBC: 13.6 K/uL — ABNORMAL HIGH (ref 4.0–10.5)
WBC: 11.7 K/uL — ABNORMAL HIGH (ref 4.0–10.5)
WBC: 11.5 K/uL — ABNORMAL HIGH (ref 4.0–10.5)
nRBC: 0 % (ref 0.0–0.2)
nRBC: 0 % (ref 0.0–0.2)
nRBC: 0 % (ref 0.0–0.2)
nRBC: 0 % (ref 0.0–0.2)

## 2024-11-04 LAB — HEMOGLOBIN A1C
Hgb A1c MFr Bld: 5.3 % (ref 4.8–5.6)
Mean Plasma Glucose: 105.41 mg/dL

## 2024-11-04 LAB — SURGICAL PCR SCREEN
MRSA, PCR: NEGATIVE
Staphylococcus aureus: NEGATIVE

## 2024-11-04 LAB — COMPREHENSIVE METABOLIC PANEL WITH GFR
ALT: 10 U/L (ref 0–44)
AST: 17 U/L (ref 15–41)
Albumin: 3.3 g/dL — ABNORMAL LOW (ref 3.5–5.0)
Alkaline Phosphatase: 49 U/L (ref 38–126)
Anion gap: 8 (ref 5–15)
BUN: 9 mg/dL (ref 8–23)
CO2: 23 mmol/L (ref 22–32)
Calcium: 8.5 mg/dL — ABNORMAL LOW (ref 8.9–10.3)
Chloride: 104 mmol/L (ref 98–111)
Creatinine, Ser: 0.84 mg/dL (ref 0.61–1.24)
GFR, Estimated: >60 mL/min (ref >60)
Glucose, Bld: 102 mg/dL — ABNORMAL HIGH (ref 70–99)
Potassium: 3.5 mmol/L (ref 3.5–5.1)
Sodium: 135 mmol/L (ref 135–145)
Total Bilirubin: 0.5 mg/dL (ref 0.0–1.2)
Total Protein: 5.9 g/dL — ABNORMAL LOW (ref 6.5–8.1)

## 2024-11-04 LAB — BASIC METABOLIC PANEL WITH GFR
Anion gap: 10 (ref 5–15)
BUN: 7 mg/dL — ABNORMAL LOW (ref 8–23)
CO2: 24 mmol/L (ref 22–32)
Calcium: 7.4 mg/dL — ABNORMAL LOW (ref 8.9–10.3)
Chloride: 109 mmol/L (ref 98–111)
Creatinine, Ser: 0.81 mg/dL (ref 0.61–1.24)
GFR, Estimated: >60 mL/min (ref >60)
Glucose, Bld: 180 mg/dL — ABNORMAL HIGH (ref 70–99)
Potassium: 3.8 mmol/L (ref 3.5–5.1)
Sodium: 143 mmol/L (ref 135–145)

## 2024-11-04 LAB — FIBRINOGEN: Fibrinogen: 219 mg/dL (ref 210–475)

## 2024-11-04 LAB — LIPID PANEL
Cholesterol: 135 mg/dL (ref 0–200)
HDL: 38 mg/dL — ABNORMAL LOW (ref >40)
LDL Cholesterol: 78 mg/dL (ref 0–99)
Total CHOL/HDL Ratio: 3.6 ratio
Triglycerides: 95 mg/dL (ref <150)
VLDL: 19 mg/dL (ref 0–40)

## 2024-11-04 LAB — PREPARE RBC (CROSSMATCH)

## 2024-11-04 LAB — VAS US DOPPLER PRE CABG

## 2024-11-04 LAB — ECHO INTRAOPERATIVE TEE
Height: 72 in
S' Lateral: 3.2 cm
Weight: 2400 [oz_av]

## 2024-11-04 LAB — PLATELET COUNT: Platelets: 155 K/uL (ref 150–400)

## 2024-11-04 LAB — PROTIME-INR
INR: 1.5 — ABNORMAL HIGH (ref 0.8–1.2)
Prothrombin Time: 19.2 s — ABNORMAL HIGH (ref 11.4–15.2)

## 2024-11-04 LAB — APTT: aPTT: 34 s (ref 24–36)

## 2024-11-04 LAB — TSH: TSH: 2.107 u[IU]/mL (ref 0.350–4.500)

## 2024-11-04 LAB — MAGNESIUM: Magnesium: 3.4 mg/dL — ABNORMAL HIGH (ref 1.7–2.4)

## 2024-11-04 SURGERY — CORONARY ARTERY BYPASS GRAFTING (CABG)
Anesthesia: General | Site: Chest

## 2024-11-04 MED ORDER — FENTANYL CITRATE (PF) 250 MCG/5ML IJ SOLN
INTRAMUSCULAR | Status: AC
  Filled 2024-11-04: qty 5

## 2024-11-04 MED ORDER — PANTOPRAZOLE SODIUM 40 MG PO TBEC
40.0000 mg | DELAYED_RELEASE_TABLET | Freq: Every day | ORAL | Status: DC
  Administered 2024-11-06 – 2024-11-08 (×3): 40 mg via ORAL
  Filled 2024-11-04 (×3): qty 1

## 2024-11-04 MED ORDER — METOCLOPRAMIDE HCL 5 MG/ML IJ SOLN
10.0000 mg | Freq: Four times a day (QID) | INTRAMUSCULAR | Status: AC
  Administered 2024-11-04 – 2024-11-05 (×5): 10 mg via INTRAVENOUS
  Filled 2024-11-04 (×6): qty 2

## 2024-11-04 MED ORDER — METOPROLOL TARTRATE 5 MG/5ML IV SOLN
2.5000 mg | INTRAVENOUS | Status: DC | PRN
  Administered 2024-11-04: 5 mg via INTRAVENOUS
  Filled 2024-11-04: qty 5

## 2024-11-04 MED ORDER — HEPARIN SODIUM (PORCINE) 1000 UNIT/ML IJ SOLN
INTRAMUSCULAR | Status: DC | PRN
  Administered 2024-11-04: 24000 [IU] via INTRAVENOUS

## 2024-11-04 MED ORDER — SODIUM CHLORIDE 0.9 % IV SOLN: INTRAVENOUS | Status: DC | PRN

## 2024-11-04 MED ORDER — PHENYLEPHRINE 80 MCG/ML (10ML) SYRINGE FOR IV PUSH (FOR BLOOD PRESSURE SUPPORT)
PREFILLED_SYRINGE | INTRAVENOUS | Status: DC | PRN
  Administered 2024-11-04 (×7): 80 ug via INTRAVENOUS
  Administered 2024-11-04: 40 ug via INTRAVENOUS
  Administered 2024-11-04 (×2): 80 ug via INTRAVENOUS
  Administered 2024-11-04: 40 ug via INTRAVENOUS

## 2024-11-04 MED ORDER — FENTANYL CITRATE (PF) 250 MCG/5ML IJ SOLN
INTRAMUSCULAR | Status: DC | PRN
Start: 2024-11-04 — End: 2024-11-04
  Administered 2024-11-04: 100 ug via INTRAVENOUS
  Administered 2024-11-04 (×2): 150 ug via INTRAVENOUS
  Administered 2024-11-04 (×2): 50 ug via INTRAVENOUS
  Administered 2024-11-04: 100 ug via INTRAVENOUS
  Administered 2024-11-04: 150 ug via INTRAVENOUS
  Administered 2024-11-04: 100 ug via INTRAVENOUS
  Administered 2024-11-04: 200 ug via INTRAVENOUS
  Administered 2024-11-04: 100 ug via INTRAVENOUS
  Administered 2024-11-04 (×2): 50 ug via INTRAVENOUS

## 2024-11-04 MED ORDER — PHENYLEPHRINE 80 MCG/ML (10ML) SYRINGE FOR IV PUSH (FOR BLOOD PRESSURE SUPPORT)
PREFILLED_SYRINGE | INTRAVENOUS | Status: AC
  Filled 2024-11-04: qty 10

## 2024-11-04 MED ORDER — CITALOPRAM HYDROBROMIDE 20 MG PO TABS
20.0000 mg | ORAL_TABLET | Freq: Every day | ORAL | Status: DC
  Administered 2024-11-05 – 2024-11-08 (×4): 20 mg via ORAL
  Filled 2024-11-04 (×4): qty 1

## 2024-11-04 MED ORDER — EPHEDRINE SULFATE-NACL 50-0.9 MG/10ML-% IV SOSY
PREFILLED_SYRINGE | INTRAVENOUS | Status: DC | PRN
  Administered 2024-11-04 (×2): 5 mg via INTRAVENOUS

## 2024-11-04 MED ORDER — THROMBIN (RECOMBINANT) 20000 UNITS EX SOLR
CUTANEOUS | Status: AC
Start: 2024-11-04 — End: 2024-11-04
  Filled 2024-11-04: qty 20000

## 2024-11-04 MED ORDER — MAGNESIUM SULFATE 4 GM/100ML IV SOLN
4.0000 g | Freq: Once | INTRAVENOUS | Status: AC
  Administered 2024-11-04: 4 g via INTRAVENOUS
  Filled 2024-11-04: qty 100

## 2024-11-04 MED ORDER — FENTANYL CITRATE (PF) 50 MCG/ML IJ SOSY
25.0000 ug | PREFILLED_SYRINGE | INTRAMUSCULAR | Status: DC | PRN
  Administered 2024-11-04: 25 ug via INTRAVENOUS
  Administered 2024-11-04 – 2024-11-05 (×3): 50 ug via INTRAVENOUS
  Filled 2024-11-04 (×4): qty 1

## 2024-11-04 MED ORDER — METOPROLOL TARTRATE 25 MG/10 ML ORAL SUSPENSION: 12.5000 mg | Freq: Two times a day (BID) | ORAL | Status: DC

## 2024-11-04 MED ORDER — ALBUMIN HUMAN 5 % IV SOLN: INTRAVENOUS | Status: DC | PRN

## 2024-11-04 MED ORDER — MORPHINE SULFATE (PF) 2 MG/ML IV SOLN: 2.0000 mg | INTRAVENOUS | Status: AC | PRN

## 2024-11-04 MED ORDER — MIDAZOLAM HCL (PF) 2 MG/2ML IJ SOLN
2.0000 mg | INTRAMUSCULAR | Status: DC | PRN
  Administered 2024-11-04 (×3): 2 mg via INTRAVENOUS
  Filled 2024-11-04 (×2): qty 2

## 2024-11-04 MED ORDER — DEXMEDETOMIDINE HCL IN NACL 400 MCG/100ML IV SOLN: 0.0000 ug/kg/h | INTRAVENOUS | Status: DC

## 2024-11-04 MED ORDER — GELATIN ABSORBABLE MT POWD
OROMUCOSAL | Status: DC | PRN
  Administered 2024-11-04: 3 via TOPICAL

## 2024-11-04 MED ORDER — SODIUM CHLORIDE 0.9% IV SOLUTION: Freq: Once | INTRAVENOUS | Status: AC

## 2024-11-04 MED ORDER — ACETAMINOPHEN 500 MG PO TABS
1000.0000 mg | ORAL_TABLET | Freq: Four times a day (QID) | ORAL | Status: DC
  Administered 2024-11-05 – 2024-11-08 (×13): 1000 mg via ORAL
  Filled 2024-11-04 (×15): qty 2

## 2024-11-04 MED ORDER — SODIUM CHLORIDE 0.9% FLUSH: 3.0000 mL | Freq: Two times a day (BID) | INTRAVENOUS | Status: DC

## 2024-11-04 MED ORDER — SODIUM CHLORIDE 0.9% FLUSH
10.0000 mL | Freq: Two times a day (BID) | INTRAVENOUS | Status: DC
  Administered 2024-11-04: 10 mL
  Administered 2024-11-04 – 2024-11-05 (×2): 20 mL
  Administered 2024-11-06: 10 mL

## 2024-11-04 MED ORDER — HEMOSTATIC AGENTS (NO CHARGE) OPTIME
TOPICAL | Status: DC | PRN
Start: 2024-11-04 — End: 2024-11-04
  Administered 2024-11-04: 1 via TOPICAL

## 2024-11-04 MED ORDER — LACTATED RINGERS IV SOLN: INTRAVENOUS | Status: DC | PRN

## 2024-11-04 MED ORDER — PROPOFOL 10 MG/ML IV BOLUS
INTRAVENOUS | Status: AC
  Filled 2024-11-04: qty 20

## 2024-11-04 MED ORDER — SODIUM CHLORIDE 0.45 % IV SOLN: INTRAVENOUS | Status: AC | PRN

## 2024-11-04 MED ORDER — OXYCODONE HCL 5 MG PO TABS
5.0000 mg | ORAL_TABLET | ORAL | Status: DC | PRN
  Administered 2024-11-05 (×2): 10 mg via ORAL
  Administered 2024-11-05: 5 mg via ORAL
  Filled 2024-11-04 (×2): qty 2
  Filled 2024-11-04 (×2): qty 1

## 2024-11-04 MED ORDER — DEXTROSE 50 % IV SOLN: 0.0000 mL | INTRAVENOUS | Status: DC | PRN

## 2024-11-04 MED ORDER — PANTOPRAZOLE SODIUM 40 MG IV SOLR
40.0000 mg | Freq: Every day | INTRAVENOUS | Status: AC
  Administered 2024-11-04 – 2024-11-05 (×2): 40 mg via INTRAVENOUS
  Filled 2024-11-04 (×2): qty 10

## 2024-11-04 MED ORDER — POTASSIUM CHLORIDE 10 MEQ/50ML IV SOLN
10.0000 meq | INTRAVENOUS | Status: AC
  Administered 2024-11-04 (×3): 10 meq via INTRAVENOUS
  Filled 2024-11-04 (×2): qty 50

## 2024-11-04 MED ORDER — PROPOFOL 10 MG/ML IV BOLUS
INTRAVENOUS | Status: DC | PRN
  Administered 2024-11-04: 30 mg via INTRAVENOUS
  Administered 2024-11-04: 100 mg via INTRAVENOUS
  Administered 2024-11-04: 30 mg via INTRAVENOUS
  Administered 2024-11-04 (×2): 20 mg via INTRAVENOUS

## 2024-11-04 MED ORDER — NITROGLYCERIN 0.2 MG/ML ON CALL CATH LAB
INTRAVENOUS | Status: DC | PRN
  Administered 2024-11-04 (×4): 20 ug via INTRAVENOUS

## 2024-11-04 MED ORDER — NITROGLYCERIN IN D5W 200-5 MCG/ML-% IV SOLN: 0.0000 ug/min | INTRAVENOUS | Status: DC

## 2024-11-04 MED ORDER — SUGAMMADEX SODIUM 200 MG/2ML IV SOLN
INTRAVENOUS | Status: DC | PRN
  Administered 2024-11-04: 200 mg via INTRAVENOUS

## 2024-11-04 MED ORDER — ACETAMINOPHEN 160 MG/5ML PO SOLN
650.0000 mg | Freq: Once | ORAL | Status: AC
  Administered 2024-11-04: 650 mg
  Filled 2024-11-04: qty 20.3

## 2024-11-04 MED ORDER — DOCUSATE SODIUM 100 MG PO CAPS
200.0000 mg | ORAL_CAPSULE | Freq: Every day | ORAL | Status: DC
  Administered 2024-11-05 – 2024-11-06 (×2): 200 mg via ORAL
  Filled 2024-11-04 (×3): qty 2

## 2024-11-04 MED ORDER — HEPARIN SODIUM (PORCINE) 1000 UNIT/ML IJ SOLN
INTRAMUSCULAR | Status: AC
  Filled 2024-11-04: qty 1

## 2024-11-04 MED ORDER — ORAL CARE MOUTH RINSE
15.0000 mL | OROMUCOSAL | Status: DC
  Administered 2024-11-04 – 2024-11-05 (×9): 15 mL via OROMUCOSAL

## 2024-11-04 MED ORDER — PROTAMINE SULFATE 10 MG/ML IV SOLN
INTRAVENOUS | Status: AC
  Filled 2024-11-04: qty 5

## 2024-11-04 MED ORDER — MORPHINE SULFATE (PF) 2 MG/ML IV SOLN
INTRAVENOUS | Status: AC
  Administered 2024-11-04: 2 mg via INTRAVENOUS
  Filled 2024-11-04: qty 1

## 2024-11-04 MED ORDER — ROCURONIUM BROMIDE 10 MG/ML (PF) SYRINGE
PREFILLED_SYRINGE | INTRAVENOUS | Status: AC
  Filled 2024-11-04: qty 10

## 2024-11-04 MED ORDER — VANCOMYCIN HCL IN DEXTROSE 1-5 GM/200ML-% IV SOLN
1000.0000 mg | Freq: Once | INTRAVENOUS | Status: AC
  Administered 2024-11-04: 1000 mg via INTRAVENOUS
  Filled 2024-11-04: qty 200

## 2024-11-04 MED ORDER — ACETAMINOPHEN 160 MG/5ML PO SOLN
1000.0000 mg | Freq: Four times a day (QID) | ORAL | Status: DC
  Filled 2024-11-04: qty 40.6

## 2024-11-04 MED ORDER — CLEVIDIPINE BUTYRATE 0.5 MG/ML IV EMUL: 0.0000 mg/h | INTRAVENOUS | Status: DC

## 2024-11-04 MED ORDER — METOPROLOL TARTRATE 12.5 MG HALF TABLET
12.5000 mg | ORAL_TABLET | Freq: Two times a day (BID) | ORAL | Status: DC
  Administered 2024-11-05 – 2024-11-06 (×3): 12.5 mg via ORAL
  Filled 2024-11-04 (×4): qty 1

## 2024-11-04 MED ORDER — ROCURONIUM BROMIDE 10 MG/ML (PF) SYRINGE
PREFILLED_SYRINGE | INTRAVENOUS | Status: DC | PRN
  Administered 2024-11-04: 20 mg via INTRAVENOUS
  Administered 2024-11-04: 30 mg via INTRAVENOUS
  Administered 2024-11-04: 70 mg via INTRAVENOUS
  Administered 2024-11-04: 30 mg via INTRAVENOUS
  Administered 2024-11-04: 50 mg via INTRAVENOUS

## 2024-11-04 MED ORDER — CHLORHEXIDINE GLUCONATE CLOTH 2 % EX PADS
6.0000 | MEDICATED_PAD | Freq: Every day | CUTANEOUS | Status: DC
  Administered 2024-11-04 – 2024-11-05 (×2): 6 via TOPICAL

## 2024-11-04 MED ORDER — ONDANSETRON HCL 4 MG/2ML IJ SOLN: 4.0000 mg | Freq: Four times a day (QID) | INTRAMUSCULAR | Status: DC | PRN

## 2024-11-04 MED ORDER — ASPIRIN 325 MG PO TBEC
325.0000 mg | DELAYED_RELEASE_TABLET | Freq: Every day | ORAL | Status: DC
  Administered 2024-11-05 – 2024-11-08 (×4): 325 mg via ORAL
  Filled 2024-11-04 (×4): qty 1

## 2024-11-04 MED ORDER — TRAMADOL HCL 50 MG PO TABS
50.0000 mg | ORAL_TABLET | ORAL | Status: DC | PRN
  Administered 2024-11-05 (×4): 50 mg via ORAL
  Filled 2024-11-04 (×4): qty 1

## 2024-11-04 MED ORDER — SODIUM CHLORIDE 0.9% FLUSH: 3.0000 mL | INTRAVENOUS | Status: DC | PRN

## 2024-11-04 MED ORDER — PHENYLEPHRINE HCL-NACL 20-0.9 MG/250ML-% IV SOLN: 0.0000 ug/min | INTRAVENOUS | Status: DC

## 2024-11-04 MED ORDER — MIDAZOLAM HCL (PF) 5 MG/ML IJ SOLN
INTRAMUSCULAR | Status: DC | PRN
  Administered 2024-11-04: 2 mg via INTRAVENOUS
  Administered 2024-11-04 (×2): 1 mg via INTRAVENOUS
  Administered 2024-11-04 (×2): 2 mg via INTRAVENOUS

## 2024-11-04 MED ORDER — LACTATED RINGERS IV SOLN: INTRAVENOUS | Status: AC

## 2024-11-04 MED ORDER — MIDAZOLAM HCL (PF) 10 MG/2ML IJ SOLN
INTRAMUSCULAR | Status: AC
  Filled 2024-11-04: qty 2

## 2024-11-04 MED ORDER — CLEVIDIPINE BUTYRATE 0.5 MG/ML IV EMUL
INTRAVENOUS | Status: DC | PRN
  Administered 2024-11-04: 2 mg/h via INTRAVENOUS

## 2024-11-04 MED ORDER — INSULIN REGULAR(HUMAN) IN NACL 100-0.9 UT/100ML-% IV SOLN: INTRAVENOUS | Status: DC

## 2024-11-04 MED ORDER — EPHEDRINE 5 MG/ML INJ
INTRAVENOUS | Status: AC
Start: 2024-11-04 — End: 2024-11-04
  Filled 2024-11-04: qty 5

## 2024-11-04 MED ORDER — CEFAZOLIN SODIUM-DEXTROSE 2-4 GM/100ML-% IV SOLN
2.0000 g | Freq: Three times a day (TID) | INTRAVENOUS | Status: AC
  Administered 2024-11-04 – 2024-11-06 (×6): 2 g via INTRAVENOUS
  Filled 2024-11-04 (×6): qty 100

## 2024-11-04 MED ORDER — ORAL CARE MOUTH RINSE: 15.0000 mL | OROMUCOSAL | Status: DC | PRN

## 2024-11-04 MED ORDER — NOREPINEPHRINE 4 MG/250ML-% IV SOLN: 0.0000 ug/min | INTRAVENOUS | Status: DC

## 2024-11-04 MED ORDER — THROMBIN (RECOMBINANT) 20000 UNITS EX SOLR
CUTANEOUS | Status: DC | PRN
  Administered 2024-11-04: 20000 [IU] via TOPICAL

## 2024-11-04 MED ORDER — PROTAMINE SULFATE 10 MG/ML IV SOLN
INTRAVENOUS | Status: DC | PRN
  Administered 2024-11-04: 40 mg via INTRAVENOUS
  Administered 2024-11-04: 20 mg via INTRAVENOUS
  Administered 2024-11-04: 30 mg via INTRAVENOUS
  Administered 2024-11-04: 50 mg via INTRAVENOUS
  Administered 2024-11-04: 40 mg via INTRAVENOUS
  Administered 2024-11-04: 30 mg via INTRAVENOUS
  Administered 2024-11-04: 10 mg via INTRAVENOUS
  Administered 2024-11-04: 30 mg via INTRAVENOUS
  Administered 2024-11-04: 40 mg via INTRAVENOUS

## 2024-11-04 MED ORDER — CLEVIDIPINE BUTYRATE 0.5 MG/ML IV EMUL
INTRAVENOUS | Status: AC
Start: 2024-11-04 — End: 2024-11-04
  Filled 2024-11-04: qty 50

## 2024-11-04 MED ORDER — 0.9 % SODIUM CHLORIDE (POUR BTL) OPTIME
TOPICAL | Status: DC | PRN
  Administered 2024-11-04: 5000 mL

## 2024-11-04 MED ORDER — BISACODYL 10 MG RE SUPP: 10.0000 mg | Freq: Every day | RECTAL | Status: DC

## 2024-11-04 MED ORDER — SODIUM CHLORIDE 0.9 % IV SOLN: INTRAVENOUS | Status: AC

## 2024-11-04 MED ORDER — MIDAZOLAM HCL 2 MG/2ML IJ SOLN
INTRAMUSCULAR | Status: AC
  Filled 2024-11-04: qty 2

## 2024-11-04 MED ORDER — BISACODYL 5 MG PO TBEC
10.0000 mg | DELAYED_RELEASE_TABLET | Freq: Every day | ORAL | Status: DC
  Administered 2024-11-05 – 2024-11-06 (×2): 10 mg via ORAL
  Filled 2024-11-04 (×3): qty 2

## 2024-11-04 MED ORDER — SODIUM BICARBONATE 8.4 % IV SOLN
50.0000 meq | Freq: Once | INTRAVENOUS | Status: AC
  Administered 2024-11-04: 50 meq via INTRAVENOUS

## 2024-11-04 MED ORDER — ASPIRIN 81 MG PO CHEW
324.0000 mg | CHEWABLE_TABLET | Freq: Once | ORAL | Status: AC
  Administered 2024-11-04: 324 mg via ORAL
  Filled 2024-11-04: qty 4

## 2024-11-04 MED ORDER — SODIUM CHLORIDE 0.9 % IV SOLN: 250.0000 mL | INTRAVENOUS | Status: DC

## 2024-11-04 MED ORDER — ALBUMIN HUMAN 5 % IV SOLN
250.0000 mL | INTRAVENOUS | Status: DC | PRN
  Administered 2024-11-04 (×3): 12.5 g via INTRAVENOUS
  Filled 2024-11-04 (×2): qty 250

## 2024-11-04 MED ORDER — CHLORHEXIDINE GLUCONATE 0.12 % MT SOLN
15.0000 mL | OROMUCOSAL | Status: AC
  Administered 2024-11-04: 15 mL via OROMUCOSAL
  Filled 2024-11-04: qty 15

## 2024-11-04 MED ORDER — LACTATED RINGERS IV SOLN: INTRAVENOUS | Status: DC

## 2024-11-04 MED ORDER — PROTAMINE SULFATE 10 MG/ML IV SOLN
INTRAVENOUS | Status: AC
Start: 2024-11-04 — End: 2024-11-04
  Filled 2024-11-04: qty 25

## 2024-11-04 MED ORDER — ASPIRIN 81 MG PO CHEW: 324.0000 mg | CHEWABLE_TABLET | Freq: Every day | ORAL | Status: DC

## 2024-11-04 MED ORDER — PLASMA-LYTE A IV SOLN: INTRAVENOUS | Status: DC | PRN

## 2024-11-04 SURGICAL SUPPLY — 83 items
ADAPTER CARDIO PERF ANTE/RETRO (ADAPTER) IMPLANT
ADAPTER MULTI PERFUSION 15 (ADAPTER) ×2 IMPLANT
BAG DECANTER FOR FLEXI CONT (MISCELLANEOUS) ×2 IMPLANT
BLADE CLIPPER SURG (BLADE) ×2 IMPLANT
BLADE STERNUM SYSTEM 6 (BLADE) ×2 IMPLANT
BNDG ELASTIC 4INX 5YD STR LF (GAUZE/BANDAGES/DRESSINGS) IMPLANT
BNDG ELASTIC 4X5.8 VLCR STR LF (GAUZE/BANDAGES/DRESSINGS) ×2 IMPLANT
BNDG ELASTIC 6INX 5YD STR LF (GAUZE/BANDAGES/DRESSINGS) ×2 IMPLANT
BNDG GAUZE DERMACEA FLUFF 4 (GAUZE/BANDAGES/DRESSINGS) ×2 IMPLANT
CANISTER SUCTION 3000ML PPV (SUCTIONS) ×2 IMPLANT
CANNULA AORTIC ROOT 9FR (CANNULA) ×2 IMPLANT
CANNULA GUNDRY RCSP 15FR (MISCELLANEOUS) IMPLANT
CANNULA MC2 2 STG 36/46 NON-V (CANNULA) IMPLANT
CANNULA NON VENT 20FR 12 (CANNULA) IMPLANT
CANNULA VESSEL 3MM BLUNT TIP (CANNULA) ×2 IMPLANT
CATH ROBINSON RED A/P 18FR (CATHETERS) ×4 IMPLANT
CATH THOR RT ANG 28F 9128 SOFT (CATHETERS) ×2 IMPLANT
CATH THORACIC 28FR (CATHETERS) ×2 IMPLANT
CATH THORACIC 36FR (CATHETERS) ×2 IMPLANT
CLIP TI MEDIUM 24 (CLIP) IMPLANT
CLIP TI WIDE RED SMALL 24 (CLIP) IMPLANT
CONTAINER PROTECT SURGISLUSH (MISCELLANEOUS) ×4 IMPLANT
DERMABOND ADVANCED .7 DNX12 (GAUZE/BANDAGES/DRESSINGS) IMPLANT
DRAIN CHANNEL 28F RND 3/8 FF (WOUND CARE) ×2 IMPLANT
DRAPE SRG 135X102X78XABS (DRAPES) ×2 IMPLANT
DRAPE WARM FLUID 44X44 (DRAPES) ×2 IMPLANT
DRSG COVADERM 4X14 (GAUZE/BANDAGES/DRESSINGS) ×2 IMPLANT
ELECTRODE REM PT RTRN 9FT ADLT (ELECTROSURGICAL) ×4 IMPLANT
FELT TEFLON 1X6 (MISCELLANEOUS) ×2 IMPLANT
GAUZE SPONGE 4X4 12PLY STRL (GAUZE/BANDAGES/DRESSINGS) ×4 IMPLANT
GLOVE BIOGEL PI IND STRL 7.0 (GLOVE) ×4 IMPLANT
GLOVE BIOGEL PI MICRO STRL 7 (GLOVE) ×6 IMPLANT
GOWN STRL REUS W/ TWL LRG LVL3 (GOWN DISPOSABLE) ×10 IMPLANT
GOWN STRL REUS W/ TWL XL LVL3 (GOWN DISPOSABLE) ×4 IMPLANT
HEMOSTAT POWDER SURGIFOAM 1G (HEMOSTASIS) ×6 IMPLANT
HEMOSTAT SURGICEL 2X14 (HEMOSTASIS) ×2 IMPLANT
INSERT FOGARTY XLG (MISCELLANEOUS) ×2 IMPLANT
INSERT SUTURE HOLDER (MISCELLANEOUS) ×2 IMPLANT
KIT BASIN OR (CUSTOM PROCEDURE TRAY) ×2 IMPLANT
KIT SUCTION CATH 14FR (SUCTIONS) ×2 IMPLANT
KIT TURNOVER KIT B (KITS) ×2 IMPLANT
KIT VASOVIEW HEMOPRO 2 VH 4000 (KITS) ×2 IMPLANT
LEAD PACING MYOCARDI (MISCELLANEOUS) IMPLANT
MARKER GRAFT CORONARY BYPASS (MISCELLANEOUS) ×6 IMPLANT
PACK E OPEN HEART (SUTURE) ×2 IMPLANT
PACK OPEN HEART (CUSTOM PROCEDURE TRAY) ×2 IMPLANT
PAD ARMBOARD POSITIONER FOAM (MISCELLANEOUS) ×4 IMPLANT
PAD ELECT DEFIB RADIOL ZOLL (MISCELLANEOUS) ×2 IMPLANT
PENCIL BUTTON HOLSTER BLD 10FT (ELECTRODE) ×2 IMPLANT
POSITIONER HEAD DONUT 9IN (MISCELLANEOUS) ×2 IMPLANT
PUNCH AORTIC ROTATE 4.0MM (MISCELLANEOUS) ×2 IMPLANT
SEALANT HEMOST PREVELEAK 4ML (HEMOSTASIS) IMPLANT
SET MPS 3-ND DEL (MISCELLANEOUS) IMPLANT
SOLN 0.9% NACL POUR BTL 1000ML (IV SOLUTION) ×10 IMPLANT
SOLN STERILE WATER BTL 1000 ML (IV SOLUTION) ×4 IMPLANT
SPONGE T-LAP 18X18 ~~LOC~~+RFID (SPONGE) IMPLANT
SUPPORT HEART JANKE-BARRON (MISCELLANEOUS) ×2 IMPLANT
SUT BONE WAX W31G (SUTURE) ×2 IMPLANT
SUT ETHIBOND X763 2 0 SH 1 (SUTURE) ×8 IMPLANT
SUT MNCRL AB 4-0 PS2 18 (SUTURE) IMPLANT
SUT PROLENE 4 0 SH DA (SUTURE) ×2 IMPLANT
SUT PROLENE 4-0 RB1 .5 CRCL 36 (SUTURE) IMPLANT
SUT PROLENE 6 0 C 1 30 (SUTURE) ×4 IMPLANT
SUT PROLENE 7 0 BV1 MDA (SUTURE) ×2 IMPLANT
SUT SILK 1 MH (SUTURE) IMPLANT
SUT STEEL 6MS V (SUTURE) ×2 IMPLANT
SUT STEEL STERNAL CCS#1 18IN (SUTURE) IMPLANT
SUT STEEL SZ 6 DBL 3X14 BALL (SUTURE) ×4 IMPLANT
SUT VIC AB 1 CTX36XBRD ANBCTR (SUTURE) ×4 IMPLANT
SUT VIC AB 2-0 CT1 TAPERPNT 27 (SUTURE) IMPLANT
SUT VIC AB 2-0 CTX 27 (SUTURE) IMPLANT
SYSTEM SAHARA CHEST DRAIN ATS (WOUND CARE) ×2 IMPLANT
TAPE CLOTH SURG 4X10 WHT LF (GAUZE/BANDAGES/DRESSINGS) IMPLANT
TAPE PAPER 2X10 WHT MICROPORE (GAUZE/BANDAGES/DRESSINGS) IMPLANT
TOWEL GREEN STERILE (TOWEL DISPOSABLE) ×2 IMPLANT
TOWEL GREEN STERILE FF (TOWEL DISPOSABLE) ×2 IMPLANT
TRAY FOLEY SLVR 16FR TEMP STAT (SET/KITS/TRAYS/PACK) ×2 IMPLANT
TUBE CONNECTING 20X1/4 (TUBING) IMPLANT
TUBE SUCT INTRACARD DLP 20F (MISCELLANEOUS) ×2 IMPLANT
TUBE SUCTION CARDIAC 10FR (CANNULA) ×2 IMPLANT
TUBING LAP HI FLOW INSUFFLATIO (TUBING) ×2 IMPLANT
UNDERPAD 30X36 HEAVY ABSORB (UNDERPADS AND DIAPERS) ×2 IMPLANT
YANKAUER SUCT BULB TIP NO VENT (SUCTIONS) IMPLANT

## 2024-11-04 NOTE — Progress Notes (Signed)
   [address] [place] [place] [address]             [phone]   S/p CABG x 3  Extubated  BP (!) 156/60   Pulse (!) 117   Temp (!) 96.4 F (35.8 C)   Resp (!) 21   Ht 6' (1.829 m)   Wt 68 kg   SpO2 100%   BMI 20.34 kg/m  Hypertensive since extubated- On Cleviprex drip IV metoprolol ordered O2 sat 99% CI 3.7  Intake/Output Summary (Last 24 hours) at 11/04/2024 1711 Last data filed at 11/04/2024 1349 Gross per 24 hour  Intake 3862.75 ml  Output 5010 ml  Net -1147.25 ml   CT 110/130/160 - PLTS transfused, FFP being given  Hgb 8.2, PLT 105K pre transfusion  Monitor closely  Elspeth C. Kerrin, MD [facility] [phone]

## 2024-11-04 NOTE — Anesthesia Postprocedure Evaluation (Signed)
 Anesthesia Post Note  Patient: Jon Terry  Procedure(s) Performed: CORONARY ARTERY BYPASS GRAFTING (CABG) TIMES THREE USING LEFT INTERNAL MAMMARY ARTERY AND ENDOSCOPICALLY HARVESTED RIGHT GREATER SAPHENOUS VEIN (Chest) ECHOCARDIOGRAM, TRANSESOPHAGEAL, INTRAOPERATIVE     Patient location during evaluation: SICU Anesthesia Type: General Level of consciousness: sedated Pain management: pain level controlled Vital Signs Assessment: post-procedure vital signs reviewed and stable Respiratory status: patient remains intubated per anesthesia plan Cardiovascular status: stable Postop Assessment: no apparent nausea or vomiting Anesthetic complications: no Comments: Of note: Patient has tiny PFO on TEE. No shunt noted. (Unable to edit TEE report)   No notable events documented.  Last Vitals:  Vitals:   11/04/24 1545 11/04/24 1600  BP:  93/60  Pulse: 92 92  Resp: 19 19  Temp: (!) 36 C (!) 35.8 C  SpO2: 100% 100%    Last Pain:  Vitals:   11/04/24 0656  TempSrc: Oral  PainSc:                  Kjuan Seipp D Elidia Bonenfant

## 2024-11-04 NOTE — Brief Op Note (Signed)
 11/03/2024 - 11/04/2024  12:10 PM  PATIENT:  Jon Terry  73 y.o. male  PRE-OPERATIVE DIAGNOSIS:  CORONARY ARTERY DISEASE, UNSTABLE ANGINA PECTORIS  POST-OPERATIVE DIAGNOSIS:   CORONARY ARTERY DISEASE, UNSTABLE ANGINA PECTORIS  PROCEDURE:   CORONARY ARTERY BYPASS GRAFTING (CABG)  X 3 USING LEFT INTERNAL MAMMARY ARTERY AND ENDOSCOPICALLY HARVESTED RIGHT GREATER SAPHENOUS VEIN  LIMA-LAD SVG-D1 SVG-OM2   Vein harvest time: Vein prep time:   ECHOCARDIOGRAM, TRANSESOPHAGEAL, INTRAOPERATIVE  SURGEON:  Daniel Con RAMAN, MD - Primary  PHYSICIAN ASSISTANT: Anastassia Noack  ASSISTANTS: Dyann Iha, RN, Scrub Person         Price, Evalene BIRCH, RN, RN First Assistant   ANESTHESIA:   general  EBL:   BLOOD ADMINISTERED:none  DRAINS: left pleural and mediastinal drains   LOCAL MEDICATIONS USED:  NONE  COUNTS:  Correct  DICTATION: .Dragon Dictation  PLAN OF CARE: Admit to inpatient   PATIENT DISPOSITION:  ICU - intubated and critically ill.   Delay start of Pharmacological VTE agent (>24hrs) due to surgical blood loss or risk of bleeding: yes

## 2024-11-04 NOTE — Anesthesia Preprocedure Evaluation (Addendum)
 Anesthesia Evaluation  Patient identified by MRN, date of birth, ID band Patient awake    Reviewed: Allergy & Precautions, NPO status , Patient's Chart, lab work & pertinent test results  Airway Mallampati: II  TM Distance: >3 FB Neck ROM: Full    Dental  (+) Teeth Intact, Dental Advisory Given   Pulmonary former smoker   breath sounds clear to auscultation       Cardiovascular + angina  + CAD   Rhythm:Regular Rate:Normal  Echo:   1. Left ventricular ejection fraction, by estimation, is 55 to 60%. Left  ventricular ejection fraction by 3D volume is 58 %. The left ventricle has  normal function. The left ventricle has no regional wall motion  abnormalities. Left ventricular diastolic   parameters were normal. The average left ventricular global longitudinal  strain is -24.2 %. The global longitudinal strain is normal.   2. Right ventricular systolic function is normal. The right ventricular  size is mildly enlarged. There is normal pulmonary artery systolic  pressure. The estimated right ventricular systolic pressure is 17.9 mmHg.   3. The mitral valve is normal in structure. Mild mitral valve  regurgitation. No evidence of mitral stenosis.   4. The aortic valve is normal in structure. Aortic valve regurgitation is  mild. Aortic valve sclerosis/calcification is present, without any  evidence of aortic stenosis. Aortic regurgitation PHT measures 931 msec.   5. The inferior vena cava is normal in size with greater than 50%  respiratory variability, suggesting right atrial pressure of 3 mmHg.     Neuro/Psych  PSYCHIATRIC DISORDERS Anxiety Depression     Neuromuscular disease    GI/Hepatic Neg liver ROS,GERD  ,,  Endo/Other  negative endocrine ROS    Renal/GU negative Renal ROS     Musculoskeletal negative musculoskeletal ROS (+)    Abdominal   Peds  Hematology negative hematology ROS (+)   Anesthesia Other  Findings   Reproductive/Obstetrics                              Anesthesia Physical Anesthesia Plan  ASA: 4  Anesthesia Plan: General   Post-op Pain Management:    Induction: Intravenous  PONV Risk Score and Plan: 2 and Ondansetron  and Midazolam   Airway Management Planned: Oral ETT  Additional Equipment: Arterial line, PA Cath, TEE and Ultrasound Guidance Line Placement  Intra-op Plan:   Post-operative Plan: Post-operative intubation/ventilation  Informed Consent: I have reviewed the patients History and Physical, chart, labs and discussed the procedure including the risks, benefits and alternatives for the proposed anesthesia with the patient or authorized representative who has indicated his/her understanding and acceptance.     Dental advisory given  Plan Discussed with: CRNA  Anesthesia Plan Comments:          Anesthesia Quick Evaluation

## 2024-11-04 NOTE — Plan of Care (Signed)
  Problem: Education: Goal: Understanding of CV disease, CV risk reduction, and recovery process will improve Outcome: Progressing   Problem: Cardiovascular: Goal: Ability to achieve and maintain adequate cardiovascular perfusion will improve Outcome: Progressing Goal: Vascular access site(s) Level 0-1 will be maintained Outcome: Progressing   Problem: Education: Goal: Knowledge of General Education information will improve Description: Including pain rating scale, medication(s)/side effects and non-pharmacologic comfort measures Outcome: Progressing   Problem: Clinical Measurements: Goal: Respiratory complications will improve Outcome: Progressing

## 2024-11-04 NOTE — Anesthesia Procedure Notes (Signed)
 Arterial Line Insertion Start/End11/06/2024 7:10 AM, 11/04/2024 7:12 AM Performed by: Claudene Arlin LABOR, CRNA, CRNA  Patient location: Pre-op. Preanesthetic checklist: patient identified, IV checked, site marked, risks and benefits discussed, surgical consent, monitors and equipment checked, pre-op evaluation, timeout performed and anesthesia consent Lidocaine  1% used for infiltration Left, radial was placed Catheter size: 20 G Hand hygiene performed  and maximum sterile barriers used  Allen's test indicative of satisfactory collateral circulation Attempts: 1 Following insertion, dressing applied and Biopatch. Patient tolerated the procedure well with no immediate complications.

## 2024-11-04 NOTE — Anesthesia Procedure Notes (Signed)
 Procedure Name: Intubation Date/Time: 11/04/2024 7:54 AM  Performed by: Claudene Arlin LABOR, CRNAPre-anesthesia Checklist: Patient identified, Emergency Drugs available, Suction available and Patient being monitored Patient Re-evaluated:Patient Re-evaluated prior to induction Oxygen Delivery Method: Circle system utilized Preoxygenation: Pre-oxygenation with 100% oxygen Induction Type: IV induction Ventilation: Mask ventilation without difficulty Laryngoscope Size: Miller and 2 Grade View: Grade I Tube type: Oral Tube size: 8.0 mm Number of attempts: 1 Airway Equipment and Method: Stylet Placement Confirmation: ETT inserted through vocal cords under direct vision, positive ETCO2 and breath sounds checked- equal and bilateral Secured at: 23 cm Tube secured with: Tape Dental Injury: Teeth and Oropharynx as per pre-operative assessment

## 2024-11-04 NOTE — Anesthesia Procedure Notes (Signed)
 Central Venous Catheter Insertion Performed by: Tilford Franky BIRCH, MD Start/End11/06/2024 7:10 AM, 11/04/2024 7:15 AM Preanesthetic checklist: patient identified, IV checked, site marked, risks and benefits discussed, surgical consent, monitors and equipment checked, pre-op evaluation, timeout performed and anesthesia consent Triple lumen Attempts: 1

## 2024-11-04 NOTE — Progress Notes (Signed)
 Brief Progress Note  No issues overnight.  Did not sleep well but denies any chest pain.    Vitals:   11/03/24 1640 11/03/24 2212  BP: 118/74 123/79  Pulse: 61 80  Resp: 17 16  Temp: 97.9 F (36.6 C) 97.9 F (36.6 C)  SpO2: 99%    Plan: Proceed to OR today for CABG.  Con Clunes, MD Cardiothoracic Surgery Pager: [phone]

## 2024-11-04 NOTE — Op Note (Signed)
 Date of Surgery: 11/04/24  Preop Diagnosis:  Multivessel coronary artery disease Postop Diagnosis: Same Surgeon:  Con GORMAN Clunes, MD Assistant(s): Laurel Becket PA-C (performed right [facility])  Experienced assistance was necessary for this case due to surgical complexity. Myron assisted by independently harvesting the saphenous vein endoscopically and closing the leg incision. He then assisted with retraction of delicate tissues, exposure, suctioning, and suture management during the anastomosis.  Anesthesia: GET  Procedures: 1. CABG x 3 (SVG - OM2, SVG - D1, LIMA - LAD) 2. Endoscopic Vein Harvest    Cross clamp time: 106 min Bypass time: 143 min  Approach:  Median sternotomy Drains: 1 left pleural 28 fr, 1 mediastinal 28 Fr right angle, 1 mediastinal 36 Fr straight Pacing Wires: atrial and ventricular  Estimated Blood Loss: 450 mL Blood Products Administered: none Disposition:  ICU Specimens:  none Findings:  Very diminutive OM1 and OM2.  Indication: Jon Terry is a very pleasant 73 year old man who presents after LHC demonstrated multivessel CAD. He has been having chest pain at rest for several months, including this morning right before LHC. He is very active and independent in his ADLs. 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 He has a 6-7 pack year smoking history and drinks 2-3 beers a day. He has no prior chest surgery, no radiation and does not take blood thinners. He is a good candidate for CABG so we will proceed tomorrow with surgery.    Procedure in Detail:  The risks, benefits, complications, treatment options, and expected outcomes were discussed with the patient. The possibilities of reaction to medication, pulmonary aspiration, perforation of viscus, bleeding, recurrent infection, the need for additional procedures, failure to diagnose a condition, and creating a complication requiring transfusion or operation were discussed with the  patient. The patient concurred with the proposed plan, giving informed consent. The patient was taken to the operating room, identified as Jon Terry and the procedure verified as Coronary Artery Bypass Grafting. A Time Out was held and the above information confirmed.  The patient was prepped and draped in a sterile fashion. An endoscopic vein harvesting in the right leg was carried out by [employer].  A standard median sternotomy was performed. The left internal mammary artery was taken down using cautery and clips and left pleural chest tube was placed.  Intervenous heparin was given at the appropriate dose to obtain anticoagulation sufficient for CPB. The LIMA was then transected distally. The flow was excellent.   The pericardium was then opened and the pericardial well was created. The aorta was palpated and free of calcium/plaque.  Concentric purse strings were placed and the aorta was cannulated with a 20 fr EOPA cannula. After adequate de-airing this cannula was connected to the CPB circuit.  A purse string was placed around the right atrial appendage and it was cannulated with a large triple stage venous cannula. Prior to initiating CPB the LIMA was fashioned to the appropriate length.   Cardiopulmonary bypass was then initiated. The distal targets (OM, diag and LAD)  were then evaluated and marked. A retrograde cardioplegia cannula was placed.  The heart basket was then placed into the appropriate position. An antegrade vent/cardioplegia cannula was then placed. The aortic cross clamp was then applied and the heart was arrested with antegrade cardioplegia. Cardioplegia was then administered intermittantly to ensure adequate myocardial protection.   The distal anastomosis to the diagonal was completed first. After positioning the heart, the  artery was opened and RSV was anastomosed to the artery using 7-0 prolene in a running fashion. The flow was tested and was adequate.    Next, the distal anastomosis to  to the OM was completed. Both OMs were very small in diameter.  OM2 appeared to be the better target.  After positioning the heart, the artery was opened and RSV was anastomosed to the artery using 7-0 prolene in a running fashion. The flow was tested and was adequate.    Next the LIMA to LAD anastomosis was completed in a similar fashion with 7-0 prolene.    The proximal anastomoses were then completed in the standard fashion using 6-0 prolene in a running fashion. After adequate deairing maneuvers, the cross clamp was removed and the heart was reperfused.   Examination of all surgical sites revealed good hemostasis. Atrial and ventricular epicardial pacing wires were then placed. The patient was fully ventillated and successfully weaned off of CPB. After a test dose of protamine, the patient was fully decannulated.  The heparin was fully reversed with protamine. Again, examination of all surgical sites were evaluated for hemostasis which was good.  Post-bypass TEE revealed preserved RV/LV function and no new wall motion abnormalities.    Mediastinal drains were placed. The sternum was closed using stainless steel wires. The fascia was closed with #1 vicryl and the deep dermal with 2-0 Vicryl. The skin was closed with 3-0 Vicryl. A sterile dressing was applied over the incision.  At the end of the operation, all sponge, instruments, and needle counts were correct.   The patient tolerated the procedure without complication and was transported to the CTICU in stable condition.  Complications: none  Specimen: None  Implants: None

## 2024-11-04 NOTE — Transfer of Care (Signed)
 Immediate Anesthesia Transfer of Care Note  Patient: Jon Terry  Procedure(s) Performed: CORONARY ARTERY BYPASS GRAFTING (CABG) TIMES THREE USING LEFT INTERNAL MAMMARY ARTERY AND ENDOSCOPICALLY HARVESTED RIGHT GREATER SAPHENOUS VEIN (Chest) ECHOCARDIOGRAM, TRANSESOPHAGEAL, INTRAOPERATIVE  Patient Location: PACU  Anesthesia Type:General  Level of Consciousness: sedated and Patient remains intubated per anesthesia plan  Airway & Oxygen Therapy: Patient remains intubated per anesthesia plan and Patient placed on Ventilator (see vital sign flow sheet for setting)  Post-op Assessment: Report given to RN and Post -op Vital signs reviewed and stable  Post vital signs: Reviewed and stable  Last Vitals:  Vitals Value Taken Time  BP 96/73   83 11/04/24  1410  Temp 34.8 11/04/24  1410  Pulse 80 11/04/24  1410  Resp 16 11/04/24  1410  SpO2 100 11/04/24  1410    Last Pain:  Vitals:   11/04/24 0656  TempSrc: Oral  PainSc:          Complications: No notable events documented.

## 2024-11-04 NOTE — Discharge Summary (Signed)
 Physician Discharge Summary  Patient ID: Jon Terry MRN: [id number] DOB/AGE: [birth year] 73 y.o.  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  He also has a history of anxiety, depression, diverticulosis, and is prediabetic.  He has a positive family history for coronary artery disease.  He recently requested evaluation by cardiology service for palpitations and dyspnea on exertion.  Initial EKG was unremarkable.  Coronary CTA was recommended along with echocardiogram.  The coronary CTA showed moderate LAD stenosis and also had a suggestion of a severe proximal circumflex stenosis.  Echocardiogram showed mild aortic insufficiency and mild mitral insufficiency no aortic stenosis.  Left ventricular ejection fraction was estimated 55 to 60%.  Right ventricular function was normal.  Given the positive findings of the coronary CTA, left heart catheterization was recommended and carried out earlier today.  The study demonstrates a 65% proximal left main stenosis as well as high-grade proximal stenoses in the LAD and circumflex.  Jon Terry had chest pain at rest just prior to the left heart catheterization.  CT surgery has been asked to evaluate the patient for consideration of urgent coronary bypass grafting.   Hospital Course: Jon Terry remained stable following left heart catheterization.  He decided to proceed with surgery.  He was prepared and taken  to the operative room on 11/04/2024 where three-vessel coronary bypass grafting was carried out without complication.  The left internal mammary artery was grafted to the left anterior descending coronary artery.  Separate saphenous vein grafts were placed at the first diagonal and obtuse marginal coronary arteries.  After the procedure, he separated from cardiopulmonary bypass without any difficulty and was transferred to the ICU in stable condition. He was extubated the evening of surgery without complication. Flo trak and arterial line were removed on POD1 without complication. He developed expected acute postoperative blood loss anemia and was transfused. He developed thrombocytopenia, Lovenox was held. Last platelet count was 133,000. He was weaned off the Insulin drip. His pre op HGA1C was 5.3. Routine diuresis was started. Epicardial pacing wires and chest tubes were removed without complication. He was felt stable for transfer to the progressive unit on POD2. Lopressor was titrated to 25 mg bid for better HR control and Lasix was decreased to 20 mg daily. He has been ambulating well on room air with good oxygenation. He has been tolerating a diet and has had a bowel movement. All wounds are clean, dry, healing without signs of infection. He does have ecchymosis RLE. He is stable for discharge today.  Consults: pulmonary/intensive care  Significant Diagnostic Studies:   CLINICAL DATA:  Status post CABG.   EXAM: CHEST - 2 VIEW   COMPARISON:  11/06/2024   FINDINGS: Interval removal of right IJ central line and mediastinal/pericardial drains. The cardio pericardial silhouette is enlarged. Streaky opacity at both bases suggest atelectasis with possible tiny effusions. No pulmonary edema or pneumothorax. Telemetry leads overlie the chest.   IMPRESSION: 1. Interval removal of right IJ central line and mediastinal/pericardial  drains. 2. Bibasilar atelectasis with possible tiny effusions.      Electronically Signed   By: Camellia Candle M.D.   On: 11/07/2024 07:37  Treatments: Surgery  Date of Surgery: 11/04/24   Preop Diagnosis:  Multivessel coronary artery disease Postop Diagnosis: Same Surgeon:  Con GORMAN Clunes, MD Assistant(s): Laurel Becket PA-C (performed right [facility])   Experienced assistance was necessary for this case due to surgical complexity. Malaya Cagley assisted by independently harvesting the saphenous vein endoscopically and closing the leg incision. He then assisted with retraction of delicate tissues, exposure, suctioning, and suture management during the anastomosis.   Anesthesia: GET   Procedures: 1. CABG x 3 (SVG - OM2, SVG - D1, LIMA - LAD) 2. Endoscopic Vein Harvest      Cross clamp time: 106 min Bypass time: 143 min   Approach:  Median sternotomy Drains: 1 left pleural 28 fr, 1 mediastinal 28 Fr right angle, 1 mediastinal 36 Fr straight Pacing Wires: atrial and ventricular   Estimated Blood Loss: 450 mL Blood Products Administered: none Disposition:  ICU Specimens:  none Findings:  Very diminutive OM1 and OM2.  Discharge Exam: Blood pressure 131/69, pulse 87, temperature 98.4 F (36.9 C), temperature source Oral, resp. rate 20, height 6' (1.829 m), weight 67.6 kg, SpO2 96%.  General appearance: alert, cooperative, and no distress Neurologic: intact Heart: RRR, no arrhythmias on monitor review Lungs: normal erspiratory effort on RA, stable sats Extremities: bruising right thigh, no palpable hematoma Wound: the sternotomy incision is intact and dry  Disposition: Discharged to home in stable condition   Allergies as of 11/08/2024       Reactions   Tamsulosin  Shortness Of Breath   Codeine Hives   Penicillins Hives        Medication List     STOP taking these medications    nitroGLYCERIN 0.4 MG SL tablet Commonly known as: NITROSTAT       TAKE these medications    aspirin EC 325 MG tablet Take 1 tablet (325 mg total) by  mouth daily. What changed:  medication strength how much to take additional instructions   citalopram 20 MG tablet Commonly known as: CELEXA Take 20 mg by mouth daily.   fluticasone 50 MCG/ACT nasal spray Commonly known as: FLONASE Place 1 spray into both nostrils daily.   metoprolol tartrate 25 MG tablet Commonly known as: LOPRESSOR Take 1 tablet (25 mg total) by mouth 2 (two) times daily.   oxyCODONE  5 MG immediate release tablet Commonly known as: Oxy IR/ROXICODONE  Take 1-2 tablets (5-10 mg total) by mouth every 6 (six) hours as needed for severe pain (pain score 7-10).   rosuvastatin 40 MG tablet Commonly known as: CRESTOR Take 1 tablet (40 mg total) by mouth daily. What changed:  medication strength how much to take   Systane Ultra PF 0.4-0.3 % Soln Generic drug: Polyethyl Glyc-Propyl Glyc PF Place 1 drop into both eyes daily as needed (Dry eyes).        Follow-up Information     Su, Con GORMAN, MD. Go on 11/17/2024.   Specialty: Cardiothoracic Surgery Why: Your appointment is 12:30pm. Please arrive about 45 minutes early for chest x-ray to be performed on the second floor of the same building. Contact information: [address] Floor [place] [address] [phone]         Goodrich, Callie E, PA-C Follow up.   Specialty: Cardiology Why: Follow-up with Cardiology which was initially scheduled for 11/15/2024 was cancelled and rescheduled  for 12/01/2024 at 10:05am. Please arrive 20 minutes early for check-in. If this date/ time does not work for you, please call our office to reschedule. Contact information: [address] [place] [address] [phone]                 The patient has been discharged on:   1.Beta Blocker:  Yes [ x  ]                              No   [   ]                              If No, reason:  2.Ace Inhibitor/ARB: Yes [   ]                                     No  [  x  ]                                      If No, reason:  Soft BP  3.Statin:   Yes [ x ]                  No  [   ]                  If No, reason:  4.Ecasa:  Yes  [ x ]                  No   [   ]                  If No, reason:  5. ACS on Admission?  No  P2Y12 Inhibitor:  Yes  [   ]                                No  [x  ]    Signed: Laurel JUDITHANN Becket, PA-C 11/08/2024, 7:32 AM

## 2024-11-04 NOTE — Procedures (Signed)
 Extubation Procedure Note  Patient Details:   Name: Jon Terry DOB: Jul 23, 1951 MRN: [id number]   Airway Documentation:    Vent end date: 11/04/24 Vent end time: 1656   Evaluation  O2 sats: stable throughout Complications: No apparent complications Patient did tolerate procedure well. Bilateral Breath Sounds: Clear, Diminished   Yes,  Pt extubated to [place]. Prior to extubation pts NIF= -20 but unable to obtain VC due to agitation. Per Dr. Gretta was ok to skip VC. Pt did have audible cuff leak. Pt tolerated well, SVS, no stridor noted, strong cough and was able to state his name.  Eilidh Marcano R 11/04/2024, 5:00 PM

## 2024-11-04 NOTE — Consult Note (Signed)
 NAME:  Jon Terry, MRN:  [id number], DOB:  [birth year], LOS: 1 ADMISSION DATE:  11/03/2024, CONSULTATION DATE:  11/04/24 REFERRING MD:  Dr Daniel, CHIEF COMPLAINT:  Chest tightness   History of Present Illness:  73 yo male with known prediabetes, GERD, peyronie disease, nephrolithiasis and anxiety recently evaluated as an outpatient for chest tightness , found to have evidence of CAD on coronary CTA and referred for LHC.  LHC showed 65% proximal left main stenosis, high grade prox stenosis of LAD and Cx. Patient was recommended for CABG and underwent 3 vessel CABG today. Intra-op, patient with large volume urine loss up to 4.5 L with minimal blood loss. Post op, patient returns to ICU, intubated, hypothermic, CI 2.4, CO 4.5, SV 56 and SVR 1102 on vigileo.   Pertinent  Medical History   Past Medical History:  Diagnosis Date   Anxiety and depression    Diverticulosis 09/16/2019   GERD (gastroesophageal reflux disease)    occ   History of kidney stones 09/16/2019   Partially obstructing 6 x 3 mm calculus within the mid left ureter causing mild left hydronephrosis   Peyronie disease    Pre-diabetes    Sciatica    left and right greater on right   Seasonal allergies    Wears glasses      Significant Hospital Events: Including procedures, antibiotic start and stop dates in addition to other pertinent events   11/04/24 CABG x 3  Interim History / Subjective:  New consult, see above   Objective    Blood pressure 138/73, pulse (!) 58, temperature 98.8 F (37.1 C), temperature source Oral, resp. rate 14, height 6' (1.829 m), weight 68 kg, SpO2 97%.    Vent Mode: SIMV;PSV;PRVC Set Rate:  [16 bmp] 16 bmp Vt Set:  [620 mL] 620 mL   Intake/Output Summary (Last 24 hours) at 11/04/2024 1424 Last data filed at 11/04/2024 1349 Gross per 24 hour  Intake 4362.75 ml  Output 5010 ml  Net -647.25 ml   Filed Weights   11/03/24 1013  Weight: 68 kg    Examination: General: Adult male,  intubated and sedated HENT: [place]/AT Lungs: Clear lungs with minimal secretions through the ETT Cardiovascular: S1/S2, paced on telemetry Abdomen: Soft, non distended, non tender Extremities: Right lower extremity wrapped, left lower extremity without edema, palpable pulse Neuro: Sedated, pupils pinpoint   Resolved problem list   Assessment and Plan  Post op Vent management - Continue full vent support - Wean FiO2 for O2 > 90% - Daily SAT/SBT, rapid wean with SIMV protocol - Initially on arrival asynchronous with ventilator without consistent Vt's, overtriggering due to high mAMPs with TV pacer >> mAMPs decreased with ongoing capture but improved ventilation - Initial ABG with alkalosis, suspect due to above and now that resolved will repeat in 30 min - post op CXR pending - Precedex and PRN fentanyl  available  CABG x 3 - LIMA-LAD, SVG-D1, SVG-OM2 CAD - LVEF 55-60% with normal RV - Monitor CI, CO, goal CI > 2.2 - Neo/Levo available to maintain MAP > 65. Goal 65-85 - Post op care per TCTS - CT management per TCTS. CT x 3 - 1 pleural and 2 mediastinal drains - Epicardial pacing per TCTS, currently paced at 80 with underlying rhythm reported SR around 60's.  - Normal LVEF/RV and large volume urine output during OR, likely related to mannitol/hypothermia- ongoing volume resuscitation needed post op - ASA/statin - Post op cefazolin  Pre-diabetes - A1c 5.3 - Insulin  infusion post procedure protocol  Remote tobacco use - Quit in 1991  GERD - PPI  Hypothermia - BG normal, TSH pending. Likley environmental  - Bair hugger   Labs   CBC: Recent Labs  Lab 10/31/24 1147 11/04/24 0409 11/04/24 0812 11/04/24 1102 11/04/24 1208 11/04/24 1239 11/04/24 1242 11/04/24 1317  WBC 5.2 8.3  --   --   --   --   --   --   HGB 14.6 13.9   < > 8.8* 9.4* 8.8* 8.8* 8.5*  HCT 44.4 41.4   < > 26.0* 28.0* 26.0* 26.0* 25.0*  MCV 91 90.0  --   --   --   --   --   --   PLT 193 177  --   --   155  --   --   --    < > = values in this interval not displayed.    Basic Metabolic Panel: Recent Labs  Lab 11/04/24 0409 11/04/24 0812 11/04/24 0816 11/04/24 0927 11/04/24 1000 11/04/24 1008 11/04/24 1102 11/04/24 1239 11/04/24 1242 11/04/24 1317  NA 135   < > 140 140   < > 145 141 143 143 147*  K 3.5   < > 3.8 3.7   < > 3.7 4.9 4.1 4.2 3.7  CL 104  --  105 104  --  103 106  --  108  --   CO2 23  --   --   --   --   --   --   --   --   --   GLUCOSE 102*  --  114* 128*  --  107* 128*  --  131*  --   BUN 9  --  10 10  --  8 8  --  7*  --   CREATININE 0.84  --  0.80 0.70  --  0.60* 0.60*  --  0.60*  --   CALCIUM 8.5*  --   --   --   --   --   --   --   --   --    < > = values in this interval not displayed.   GFR: Estimated Creatinine Clearance: 79.1 mL/min (A) (by C-G formula based on SCr of 0.6 mg/dL (L)). Recent Labs  Lab 10/31/24 1147 11/04/24 0409  WBC 5.2 8.3    Liver Function Tests: Recent Labs  Lab 11/04/24 0409  AST 17  ALT 10  ALKPHOS 49  BILITOT 0.5  PROT 5.9*  ALBUMIN 3.3*   No results for input(s): LIPASE, AMYLASE in the last 168 hours. No results for input(s): AMMONIA in the last 168 hours.  ABG    Component Value Date/Time   PHART 7.392 11/04/2024 1317   PCO2ART 34.0 11/04/2024 1317   PO2ART 438 (H) 11/04/2024 1317   HCO3 20.7 11/04/2024 1317   TCO2 22 11/04/2024 1317   ACIDBASEDEF 4.0 (H) 11/04/2024 1317   O2SAT 100 11/04/2024 1317     Coagulation Profile: No results for input(s): INR, PROTIME in the last 168 hours.  Cardiac Enzymes: No results for input(s): CKTOTAL, CKMB, CKMBINDEX, TROPONINI in the last 168 hours.  HbA1C: Hgb A1c MFr Bld  Date/Time Value Ref Range Status  11/04/2024 04:09 AM 5.3 4.8 - 5.6 % Final    Comment:    (NOTE) Diagnosis of Diabetes The following HbA1c ranges recommended by the American Diabetes Association (ADA) may be used as an aid in the diagnosis of diabetes  mellitus.  Hemoglobin             Suggested A1C NGSP%              Diagnosis  <5.7                   Non Diabetic  5.7-6.4                Pre-Diabetic  >6.4                   Diabetic  <7.0                   Glycemic control for                       adults with diabetes.      CBG: No results for input(s): GLUCAP in the last 168 hours.  Review of Systems:   Unable to assess  Past Medical History:  He,  has a past medical history of Anxiety and depression, Diverticulosis (09/16/2019), GERD (gastroesophageal reflux disease), History of kidney stones (09/16/2019), Peyronie disease, Pre-diabetes, Sciatica, Seasonal allergies, and Wears glasses.   Surgical History:   Past Surgical History:  Procedure Laterality Date   COLONOSCOPY     x2   CYSTOSCOPY/URETEROSCOPY/HOLMIUM LASER/STENT PLACEMENT Left 09/21/2019   Procedure: CYSTOSCOPY/RETROGRADE/URETEROSCOPY/HOLMIUM LASER/STENT PLACEMENT;  Surgeon: Devere Lonni Righter, MD;  Location: [facility];  Service: Urology;  Laterality: Left;  ONLY NEEDS 45 MIN   HERNIA REPAIR  2010   right inguinal   LEFT HEART CATH AND CORONARY ANGIOGRAPHY N/A 11/03/2024   Procedure: LEFT HEART CATH AND CORONARY ANGIOGRAPHY;  Surgeon: Mady Lonni, MD;  Location: [facility];  Service: Cardiovascular;  Laterality: N/A;   WISDOM TOOTH EXTRACTION       Social History:   reports that he quit smoking about 34 years ago. His smoking use included cigarettes. He started smoking about 54 years ago. He has a 5 pack-year smoking history. He has never used smokeless tobacco. He reports current alcohol use. He reports that he does not currently use drugs after having used the following drugs: Marijuana.   Family History:  His family history is negative for Prostate cancer and Kidney cancer.   Allergies Allergies  Allergen Reactions   Tamsulosin  Shortness Of Breath   Codeine Hives   Penicillins Hives     Home Medications   Prior to Admission medications   Medication Sig Start Date End Date Taking? Authorizing Provider  aspirin EC 81 MG tablet Take 1 tablet (81 mg total) by mouth daily. Swallow whole. 10/29/24  Yes Goodrich, Callie E, PA-C  citalopram (CELEXA) 20 MG tablet Take 20 mg by mouth daily. 07/20/17  Yes [provider]  fluticasone (FLONASE) 50 MCG/ACT nasal spray Place 1 spray into both nostrils daily.   Yes [provider]  nitroGLYCERIN (NITROSTAT) 0.4 MG SL tablet Place 1 tablet (0.4 mg total) under the tongue every 5 (five) minutes as needed for chest pain. 10/29/24 01/27/25 Yes Goodrich, Callie E, PA-C  Polyethyl Glyc-Propyl Glyc PF (SYSTANE ULTRA PF) 0.4-0.3 % SOLN Place 1 drop into both eyes daily as needed (Dry eyes).   Yes [provider]  rosuvastatin (CRESTOR) 20 MG tablet Take 1 tablet (20 mg total) by mouth daily. 10/29/24 01/27/25 Yes Goodrich, Callie E, PA-C     Critical care time: 21

## 2024-11-04 NOTE — Anesthesia Procedure Notes (Signed)
 Central Venous Catheter Insertion Performed by: Tilford Franky BIRCH, MD, anesthesiologist Start/End11/06/2024 7:00 AM, 11/04/2024 7:10 AM Patient location: Pre-op. Preanesthetic checklist: patient identified, IV checked, site marked, risks and benefits discussed, surgical consent, monitors and equipment checked, pre-op evaluation, timeout performed and anesthesia consent Position: Trendelenburg Lidocaine  1% used for infiltration and patient sedated Hand hygiene performed , maximum sterile barriers used  and Seldinger technique used Catheter size: 8.5 Fr Total catheter length 10. Central line was placed.Sheath introducer Swan type:thermodilution PA Cath depth:50 Procedure performed using ultrasound to evaluate access site. Ultrasound Notes:relevant anatomy identified, ultrasound used to visualize needle entry, vessel patent under ultrasound and image(s) printed for medical record. Attempts: 1 Following insertion, line sutured, dressing applied and Biopatch. Post procedure assessment: blood return through all ports, free fluid flow and no air  Patient tolerated the procedure well with no immediate complications.

## 2024-11-04 NOTE — Hospital Course (Addendum)
 Referring: Jon Bruckner, MD  Primary Care: Jon Slater, MD  History of Present Illness:    Jon Terry is a 73 year old male who is a former smoker having quit in 1991.  He also has a history of anxiety, depression, diverticulosis, and is prediabetic.  He has a positive family history for coronary artery disease.  He recently requested evaluation by cardiology service for palpitations and dyspnea on exertion.  Initial EKG was unremarkable.  Coronary CTA was recommended along with echocardiogram.  The coronary CTA showed moderate LAD stenosis and also had a suggestion of a severe proximal circumflex stenosis.  Echocardiogram showed mild aortic insufficiency and mild mitral insufficiency no aortic stenosis.  Left ventricular ejection fraction was estimated 55 to 60%.  Right ventricular function was normal.  Given the positive findings of the coronary CTA, left heart catheterization was recommended and carried out earlier today.  The study demonstrates a 65% proximal left main stenosis as well as high-grade proximal stenoses in the LAD and circumflex.  Jon Terry had chest pain at rest just prior to the left heart catheterization.  CT surgery has been asked to evaluate the patient for consideration of urgent coronary bypass grafting.   Hospital Course: Jon Terry remained stable following left heart catheterization.  He decided to proceed with surgery.  He was prepared and taken to the operative room on 11/04/2024 where three-vessel coronary bypass grafting was carried out without complication.  The left internal mammary artery was grafted to the left anterior descending coronary artery.  Separate saphenous vein grafts were placed at the first diagonal and obtuse marginal coronary arteries.  After the procedure, he separated from cardiopulmonary bypass without any difficulty and was transferred to the ICU in stable condition. He was extubated the evening of surgery without complication. Flo trak and  arterial line were removed on POD1 without complication. He developed expected acute postoperative blood loss anemia and was transfused. He developed thrombocytopenia, Lovenox was held. Last platelet count was 133,000. He was weaned off the Insulin drip. His pre op HGA1C was 5.3. Routine diuresis was started. Epicardial pacing wires and chest tubes were removed without complication. He was felt stable for transfer to the progressive unit on POD2. Lopressor was titrated to 25 mg bid for better HR control and Lasix was decreased to 20 mg daily. He has been ambulating well on room air with good oxygenation. He has been tolerating a diet and has had a bowel movement. All wounds are clean, dry, healing without signs of infection. He does have ecchymosis RLE. He is stable for discharge today.

## 2024-11-04 NOTE — Discharge Instructions (Signed)
 Discharge Instructions:  1. You may shower, please wash incisions daily with soap and water  and keep dry.  If you wish to cover wounds with dressing you may do so but please keep clean and change daily.  No tub baths or swimming until incisions have completely healed.  If your incisions become red or develop any drainage please call our office at [phone]  2. No Driving until cleared by Dr. Linward office and you are no longer using narcotic pain medications  3. Monitor your weight daily.. Please use the same scale and weigh at same time... If you gain 5-10 lbs in 48 hours with associated lower extremity swelling, please contact our office at [phone]  4. Fever of 101.5 for at least 24 hours with no source, please contact our office at [phone]  5. Activity- up as tolerated, please walk at least 3 times per day.  Avoid strenuous activity, no lifting, pushing, or pulling with your arms over 8-10 lbs for a minimum of 6 weeks  6. If any questions or concerns arise, please do not hesitate to contact our office at [phone]

## 2024-11-05 ENCOUNTER — Inpatient Hospital Stay (HOSPITAL_COMMUNITY)

## 2024-11-05 DIAGNOSIS — D696 Thrombocytopenia, unspecified: Secondary | ICD-10-CM | POA: Diagnosis not present

## 2024-11-05 DIAGNOSIS — R739 Hyperglycemia, unspecified: Secondary | ICD-10-CM | POA: Diagnosis not present

## 2024-11-05 DIAGNOSIS — Z951 Presence of aortocoronary bypass graft: Secondary | ICD-10-CM | POA: Diagnosis not present

## 2024-11-05 DIAGNOSIS — G2581 Restless legs syndrome: Secondary | ICD-10-CM | POA: Diagnosis not present

## 2024-11-05 DIAGNOSIS — I25110 Atherosclerotic heart disease of native coronary artery with unstable angina pectoris: Secondary | ICD-10-CM | POA: Diagnosis not present

## 2024-11-05 DIAGNOSIS — D689 Coagulation defect, unspecified: Secondary | ICD-10-CM | POA: Diagnosis not present

## 2024-11-05 LAB — BASIC METABOLIC PANEL WITH GFR
Anion gap: 6 (ref 5–15)
Anion gap: 8 (ref 5–15)
BUN: 8 mg/dL (ref 8–23)
BUN: 11 mg/dL (ref 8–23)
CO2: 25 mmol/L (ref 22–32)
CO2: 26 mmol/L (ref 22–32)
Calcium: 7.7 mg/dL — ABNORMAL LOW (ref 8.9–10.3)
Calcium: 8.5 mg/dL — ABNORMAL LOW (ref 8.9–10.3)
Chloride: 111 mmol/L (ref 98–111)
Chloride: 101 mmol/L (ref 98–111)
Creatinine, Ser: 0.84 mg/dL (ref 0.61–1.24)
Creatinine, Ser: 0.87 mg/dL (ref 0.61–1.24)
GFR, Estimated: >60 mL/min (ref >60)
GFR, Estimated: >60 mL/min (ref >60)
Glucose, Bld: 112 mg/dL — ABNORMAL HIGH (ref 70–99)
Glucose, Bld: 140 mg/dL — ABNORMAL HIGH (ref 70–99)
Potassium: 3.7 mmol/L (ref 3.5–5.1)
Potassium: 3.9 mmol/L (ref 3.5–5.1)
Sodium: 142 mmol/L (ref 135–145)
Sodium: 135 mmol/L (ref 135–145)

## 2024-11-05 LAB — PREPARE PLATELET PHERESIS
Unit division: 0
Unit division: 0

## 2024-11-05 LAB — BPAM FFP
Blood Product Expiration Date: 202511122359
ISSUE DATE / TIME: 202511071635
Unit Type and Rh: 8400

## 2024-11-05 LAB — CBC
HCT: 23.6 % — ABNORMAL LOW (ref 39.0–52.0)
HCT: 26.6 % — ABNORMAL LOW (ref 39.0–52.0)
Hemoglobin: 7.9 g/dL — ABNORMAL LOW (ref 13.0–17.0)
Hemoglobin: 8.9 g/dL — ABNORMAL LOW (ref 13.0–17.0)
MCH: 30.4 pg (ref 26.0–34.0)
MCH: 30.2 pg (ref 26.0–34.0)
MCHC: 33.5 g/dL (ref 30.0–36.0)
MCHC: 33.5 g/dL (ref 30.0–36.0)
MCV: 90.8 fL (ref 80.0–100.0)
MCV: 90.2 fL (ref 80.0–100.0)
Platelets: 115 K/uL — ABNORMAL LOW (ref 150–400)
Platelets: 131 K/uL — ABNORMAL LOW (ref 150–400)
RBC: 2.6 MIL/uL — ABNORMAL LOW (ref 4.22–5.81)
RBC: 2.95 MIL/uL — ABNORMAL LOW (ref 4.22–5.81)
RDW: 14.8 % (ref 11.5–15.5)
RDW: 15.1 % (ref 11.5–15.5)
WBC: 7.2 K/uL (ref 4.0–10.5)
WBC: 10 K/uL (ref 4.0–10.5)
nRBC: 0 % (ref 0.0–0.2)
nRBC: 0 % (ref 0.0–0.2)

## 2024-11-05 LAB — GLUCOSE, CAPILLARY
Glucose-Capillary: 102 mg/dL — ABNORMAL HIGH (ref 70–99)
Glucose-Capillary: 107 mg/dL — ABNORMAL HIGH (ref 70–99)
Glucose-Capillary: 110 mg/dL — ABNORMAL HIGH (ref 70–99)
Glucose-Capillary: 111 mg/dL — ABNORMAL HIGH (ref 70–99)
Glucose-Capillary: 111 mg/dL — ABNORMAL HIGH (ref 70–99)
Glucose-Capillary: 112 mg/dL — ABNORMAL HIGH (ref 70–99)
Glucose-Capillary: 114 mg/dL — ABNORMAL HIGH (ref 70–99)
Glucose-Capillary: 119 mg/dL — ABNORMAL HIGH (ref 70–99)
Glucose-Capillary: 124 mg/dL — ABNORMAL HIGH (ref 70–99)
Glucose-Capillary: 133 mg/dL — ABNORMAL HIGH (ref 70–99)
Glucose-Capillary: 139 mg/dL — ABNORMAL HIGH (ref 70–99)

## 2024-11-05 LAB — LIPOPROTEIN A (LPA): Lipoprotein (a): 82.2 nmol/L — ABNORMAL HIGH (ref <75.0)

## 2024-11-05 LAB — BPAM PLATELET PHERESIS
Blood Product Expiration Date: 202511082359
Blood Product Expiration Date: 202511092359
ISSUE DATE / TIME: 202511071605
ISSUE DATE / TIME: 202511080426
Unit Type and Rh: 5100
Unit Type and Rh: 6200

## 2024-11-05 LAB — MAGNESIUM
Magnesium: 3 mg/dL — ABNORMAL HIGH (ref 1.7–2.4)
Magnesium: 2.4 mg/dL (ref 1.7–2.4)

## 2024-11-05 LAB — PREPARE FRESH FROZEN PLASMA

## 2024-11-05 MED ORDER — POTASSIUM CHLORIDE CRYS ER 20 MEQ PO TBCR
20.0000 meq | EXTENDED_RELEASE_TABLET | ORAL | Status: AC
  Administered 2024-11-05 (×3): 20 meq via ORAL
  Filled 2024-11-05 (×3): qty 1

## 2024-11-05 MED ORDER — FUROSEMIDE 10 MG/ML IJ SOLN
20.0000 mg | Freq: Two times a day (BID) | INTRAMUSCULAR | Status: DC
  Administered 2024-11-05 – 2024-11-06 (×3): 20 mg via INTRAVENOUS
  Filled 2024-11-05 (×3): qty 2

## 2024-11-05 MED ORDER — INSULIN ASPART 100 UNIT/ML IJ SOLN: 0.0000 [IU] | INTRAMUSCULAR | Status: DC

## 2024-11-05 MED ORDER — INSULIN ASPART 100 UNIT/ML IJ SOLN
0.0000 [IU] | INTRAMUSCULAR | Status: DC
  Administered 2024-11-05 – 2024-11-06 (×3): 2 [IU] via SUBCUTANEOUS
  Filled 2024-11-05 (×2): qty 2

## 2024-11-05 MED ORDER — METHOCARBAMOL 500 MG PO TABS
500.0000 mg | ORAL_TABLET | Freq: Three times a day (TID) | ORAL | Status: AC
  Administered 2024-11-05 – 2024-11-07 (×6): 500 mg via ORAL
  Filled 2024-11-05 (×6): qty 1

## 2024-11-05 MED ORDER — GABAPENTIN 100 MG PO CAPS
100.0000 mg | ORAL_CAPSULE | Freq: Three times a day (TID) | ORAL | Status: AC
  Administered 2024-11-05 – 2024-11-07 (×6): 100 mg via ORAL
  Filled 2024-11-05 (×6): qty 1

## 2024-11-05 NOTE — Progress Notes (Signed)
 NAME:  Jon Terry, MRN:  [id number], DOB:  September 17, 1951, LOS: 2 ADMISSION DATE:  11/03/2024, CONSULTATION DATE:  11/04/24 REFERRING MD:  Dr Daniel, CHIEF COMPLAINT:  Chest tightness   History of Present Illness:  73 yo male with known prediabetes, GERD, peyronie disease, nephrolithiasis and anxiety recently evaluated as an outpatient for chest tightness , found to have evidence of CAD on coronary CTA and referred for LHC.  LHC showed 65% proximal left main stenosis, high grade prox stenosis of LAD and Cx. Patient was recommended for CABG and underwent 3 vessel CABG today. Intra-op, patient with large volume urine loss up to 4.5 L with minimal blood loss. Post op, patient returns to ICU, intubated, hypothermic, CI 2.4, CO 4.5, SV 56 and SVR 1102 on vigileo.   Pertinent  Medical History   Past Medical History:  Diagnosis Date   Anxiety and depression    Diverticulosis 09/16/2019   GERD (gastroesophageal reflux disease)    occ   History of kidney stones 09/16/2019   Partially obstructing 6 x 3 mm calculus within the mid left ureter causing mild left hydronephrosis   Peyronie disease    Pre-diabetes    Restless leg syndrome    Sciatica    left and right greater on right   Seasonal allergies    Wears glasses      Significant Hospital Events: Including procedures, antibiotic start and stop dates in addition to other pertinent events   11/04/24 CABG x 3  Interim History / Subjective:  Pain has been significant this morning- limiting mobility. No issues eating.   Objective    Blood pressure 102/63, pulse 81, temperature 99 F (37.2 C), temperature source Bladder, resp. rate (!) 24, height 6' (1.829 m), weight 73.2 kg, SpO2 94%. CVP:  [0 mmHg-61 mmHg] 7 mmHg CO:  [2.5 L/min-10.3 L/min] 5.4 L/min CI:  [1.3 L/min/m2-5.5 L/min/m2] 2.9 L/min/m2  Vent Mode: PSV;CPAP FiO2 (%):  [40 %-50 %] 40 % Set Rate:  [4 bmp-16 bmp] 4 bmp Vt Set:  [620 mL] 620 mL PEEP:  [5 cmH20] 5 cmH20 Pressure  Support:  [10 cmH20] 10 cmH20   Intake/Output Summary (Last 24 hours) at 11/05/2024 1250 Last data filed at 11/05/2024 1000 Gross per 24 hour  Intake 6604.25 ml  Output 4600 ml  Net 2004.25 ml   Filed Weights   11/03/24 1013 11/05/24 0530  Weight: 68 kg 73.2 kg    Examination: General: Elderly man lying in bed no acute distress HENT: [place]/AT, eyes anicteric Lungs: Breathing comfortably on room air, reduced basilar breath sounds.  Clear anteriorly. Cardiovascular: 1 S2, regular rate and rhythm.  Sinus rhythm on telemetry.  Minimal chest tube output this morning. Abdomen: Soft, nontender  extremities: Minimal pedal edema Neuro: Awake alert, answering questions appropriately  Potassium 3.7 BUN 8 Cr 0.84 WBC 7.2 H/H 7.9/23.6 Platelets 115 CXR personally reviewed> left small effusion, chest tubes, RIJ CVC EKG personally reviewed-flattened T waves throughout, normal sinus rhythm, normal axis.  Normal intervals.  Resolved problem list   Assessment and Plan   Unstable angina with severe left main, LCx, LAD disease S/p CABG x 3- LIMA-LAD, SVG-D1, SVG-OM2 -Complete postop antibiotics today - Pain control per protocol-tramadol, oxycodone , fentanyl  as needed - Adding gabapentin and Robaxin 3 times daily for 2 days for added pain control. - Progress diet mobility today -Start metoprolol - Aspirin and statin - Continue chest tubes - Lasix twice daily - Pulmonary hygiene  Pre-diabetes -Transition to basal bolus insulin -Goal  blood glucose less than 180  GERD - Continue PPI  Patient and wife updated during bedside rounds.   Labs   CBC: Recent Labs  Lab 11/04/24 0409 11/04/24 0812 11/04/24 1208 11/04/24 1239 11/04/24 1359 11/04/24 1431 11/04/24 1540 11/04/24 1650 11/04/24 1930 11/04/24 1935 11/05/24 0318  WBC 8.3  --   --   --  13.6*  --  11.7*  --   --  11.5* 7.2  HGB 13.9   < > 9.4*   < > 9.4*   < > 8.2* 6.5* 6.1* 6.8* 7.9*  HCT 41.4   < > 28.0*   < > 27.4*   <  > 24.5* 19.0* 18.0* 20.3* 23.6*  MCV 90.0  --   --   --  90.1  --  90.7  --   --  92.7 90.8  PLT 177  --  155  --  107*  --  105*  --   --  123* 115*   < > = values in this interval not displayed.    Basic Metabolic Panel: Recent Labs  Lab 11/04/24 0409 11/04/24 0812 11/04/24 1008 11/04/24 1102 11/04/24 1239 11/04/24 1242 11/04/24 1317 11/04/24 1506 11/04/24 1650 11/04/24 1930 11/04/24 1935 11/05/24 0318  NA 135   < > 145 141   < > 143   < > 145 148* 147* 143 142  K 3.5   < > 3.7 4.9   < > 4.2   < > 3.3* 3.9 3.9 3.8 3.7  CL 104   < > 103 106  --  108  --   --   --   --  109 111  CO2 23  --   --   --   --   --   --   --   --   --  24 25  GLUCOSE 102*   < > 107* 128*  --  131*  --   --   --   --  180* 112*  BUN 9   < > 8 8  --  7*  --   --   --   --  7* 8  CREATININE 0.84   < > 0.60* 0.60*  --  0.60*  --   --   --   --  0.81 0.84  CALCIUM 8.5*  --   --   --   --   --   --   --   --   --  7.4* 7.7*  MG  --   --   --   --   --   --   --   --   --   --  3.4* 3.0*   < > = values in this interval not displayed.   GFR: Estimated Creatinine Clearance: 81.1 mL/min (by C-G formula based on SCr of 0.84 mg/dL). Recent Labs  Lab 11/04/24 1359 11/04/24 1540 11/04/24 1935 11/05/24 0318  WBC 13.6* 11.7* 11.5* 7.2    Liver Function Tests: Recent Labs  Lab 11/04/24 0409  AST 17  ALT 10  ALKPHOS 49  BILITOT 0.5  PROT 5.9*  ALBUMIN 3.3*    Critical care time:       Leita SHAUNNA Gaskins, DO 11/05/24 6:26 PM [facility]  For contact information, see Amion. If no response to pager, please call PCCM consult pager. After hours, 7PM- 7AM, please call Elink.

## 2024-11-05 NOTE — Progress Notes (Signed)
      [address]       [place] [address]             [phone]    POD # 1  Sleeping  BP 116/69   Pulse 94   Temp 99 F (37.2 C) (Oral)   Resp 15   Ht 6' (1.829 m)   Wt 73.2 kg   SpO2 94%   BMI 21.89 kg/m   RA 95% sat   Intake/Output Summary (Last 24 hours) at 11/05/2024 1829 Last data filed at 11/05/2024 1500 Gross per 24 hour  Intake 2160.76 ml  Output 1775 ml  Net 385.76 ml   Hgb 8.9, plt 131K K 3.9, creatinine 0.9  Doing well POD # 1  Prarthana Parlin C. Kerrin, MD [facility] [phone]

## 2024-11-05 NOTE — Progress Notes (Signed)
 1 Day Post-Op Procedure(s) (LRB): CORONARY ARTERY BYPASS GRAFTING (CABG) TIMES THREE USING LEFT INTERNAL MAMMARY ARTERY AND ENDOSCOPICALLY HARVESTED RIGHT GREATER SAPHENOUS VEIN (N/A) ECHOCARDIOGRAM, TRANSESOPHAGEAL, INTRAOPERATIVE (N/A) Subjective: Some incisional pain, denies nausea  Objective: Vital signs in last 24 hours: Temp:  [91.9 F (33.3 C)-99.3 F (37.4 C)] 99 F (37.2 C) (11/08 0700) Pulse Rate:  [72-117] 81 (11/08 0700) Cardiac Rhythm: Normal sinus rhythm (11/07 1945) Resp:  [0-31] 20 (11/08 0700) BP: (74-156)/(46-75) 113/67 (11/08 0700) SpO2:  [90 %-100 %] 95 % (11/08 0700) Arterial Line BP: (39-284)/(30-256) 133/53 (11/08 0700) FiO2 (%):  [40 %-50 %] 40 % (11/07 1621) Weight:  [73.2 kg] 73.2 kg (11/08 0530)  Hemodynamic parameters for last 24 hours: CVP:  [0 mmHg-61 mmHg] 7 mmHg CO:  [2.5 L/min-10.3 L/min] 5.4 L/min CI:  [1.3 L/min/m2-5.5 L/min/m2] 2.9 L/min/m2  Intake/Output from previous day: 11/07 0701 - 11/08 0700 In: 8125.2 [P.O.:700; I.V.:3207.3; Blood:1088; IV Piggyback:3130] Out: 2134 [Lmpwz:3504; Blood:450; Chest Tube:920] Intake/Output this shift: No intake/output data recorded.  General appearance: alert, cooperative, and no distress Neurologic: intact Heart: regular rate and rhythm Lungs: diminished breath sounds bibasilar Abdomen: normal findings: soft, non-tender  Lab Results: Recent Labs    11/04/24 1935 11/05/24 0318  WBC 11.5* 7.2  HGB 6.8* 7.9*  HCT 20.3* 23.6*  PLT 123* 115*   BMET:  Recent Labs    11/04/24 1935 11/05/24 0318  NA 143 142  K 3.8 3.7  CL 109 111  CO2 24 25  GLUCOSE 180* 112*  BUN 7* 8  CREATININE 0.81 0.84  CALCIUM 7.4* 7.7*    PT/INR:  Recent Labs    11/04/24 1359  LABPROT 19.2*  INR 1.5*   ABG    Component Value Date/Time   PHART 7.319 (L) 11/04/2024 1930   HCO3 24.8 11/04/2024 1930   TCO2 26 11/04/2024 1930   ACIDBASEDEF 1.0 11/04/2024 1930   O2SAT 98 11/04/2024 1930   CBG (last 3)   Recent Labs    11/05/24 0513 11/05/24 0614 11/05/24 0729  GLUCAP 102* 119* 133*    Assessment/Plan: S/P Procedure(s) (LRB): CORONARY ARTERY BYPASS GRAFTING (CABG) TIMES THREE USING LEFT INTERNAL MAMMARY ARTERY AND ENDOSCOPICALLY HARVESTED RIGHT GREATER SAPHENOUS VEIN (N/A) ECHOCARDIOGRAM, TRANSESOPHAGEAL, INTRAOPERATIVE (N/A) POD # 1 NEURO- intact CV- in SR with good hemodynamics  Dc A line, Flow track  ASA, beta blocker, statin RESP- IS for atelectasis RENAL- creatinine normal  Diurese, supplement K ENDO- CBG well controlled on minimal insulin drip  Transition to SSI GI- advance diet Will keep CT in place this morning Anemia secondary to ABL- Hgb 7.9 post transfusion Thrombocytopenia- PLT 115K post transfusion Coagulopathy resolved SCD for DVT prophylaxis, no enoxaparin due to bleeding risk Mobilize, cardiac rehab   LOS: 2 days    Jon Terry 11/05/2024

## 2024-11-05 NOTE — Plan of Care (Signed)
  Problem: Clinical Measurements: Goal: Ability to maintain clinical measurements within normal limits will improve Outcome: Progressing   Problem: Clinical Measurements: Goal: Will remain free from infection Outcome: Progressing   Problem: Clinical Measurements: Goal: Diagnostic test results will improve Outcome: Progressing   Problem: Coping: Goal: Level of anxiety will decrease Outcome: Progressing   Problem: Activity: Goal: Risk for activity intolerance will decrease Outcome: Progressing   Problem: Pain Managment: Goal: General experience of comfort will improve and/or be controlled Outcome: Progressing

## 2024-11-06 ENCOUNTER — Inpatient Hospital Stay (HOSPITAL_COMMUNITY)

## 2024-11-06 DIAGNOSIS — Z951 Presence of aortocoronary bypass graft: Secondary | ICD-10-CM | POA: Diagnosis not present

## 2024-11-06 DIAGNOSIS — I25110 Atherosclerotic heart disease of native coronary artery with unstable angina pectoris: Secondary | ICD-10-CM | POA: Diagnosis not present

## 2024-11-06 DIAGNOSIS — R739 Hyperglycemia, unspecified: Secondary | ICD-10-CM | POA: Diagnosis not present

## 2024-11-06 LAB — CBC
HCT: 30.8 % — ABNORMAL LOW (ref 39.0–52.0)
Hemoglobin: 10.1 g/dL — ABNORMAL LOW (ref 13.0–17.0)
MCH: 30.2 pg (ref 26.0–34.0)
MCHC: 32.8 g/dL (ref 30.0–36.0)
MCV: 92.2 fL (ref 80.0–100.0)
Platelets: 165 K/uL (ref 150–400)
RBC: 3.34 MIL/uL — ABNORMAL LOW (ref 4.22–5.81)
RDW: 14.9 % (ref 11.5–15.5)
WBC: 13.7 K/uL — ABNORMAL HIGH (ref 4.0–10.5)
nRBC: 0 % (ref 0.0–0.2)

## 2024-11-06 LAB — GLUCOSE, CAPILLARY
Glucose-Capillary: 115 mg/dL — ABNORMAL HIGH (ref 70–99)
Glucose-Capillary: 117 mg/dL — ABNORMAL HIGH (ref 70–99)
Glucose-Capillary: 118 mg/dL — ABNORMAL HIGH (ref 70–99)
Glucose-Capillary: 121 mg/dL — ABNORMAL HIGH (ref 70–99)
Glucose-Capillary: 128 mg/dL — ABNORMAL HIGH (ref 70–99)
Glucose-Capillary: 132 mg/dL — ABNORMAL HIGH (ref 70–99)
Glucose-Capillary: 156 mg/dL — ABNORMAL HIGH (ref 70–99)

## 2024-11-06 LAB — BASIC METABOLIC PANEL WITH GFR
Anion gap: 12 (ref 5–15)
BUN: 12 mg/dL (ref 8–23)
CO2: 23 mmol/L (ref 22–32)
Calcium: 8.9 mg/dL (ref 8.9–10.3)
Chloride: 101 mmol/L (ref 98–111)
Creatinine, Ser: 0.85 mg/dL (ref 0.61–1.24)
GFR, Estimated: >60 mL/min (ref >60)
Glucose, Bld: 128 mg/dL — ABNORMAL HIGH (ref 70–99)
Potassium: 4 mmol/L (ref 3.5–5.1)
Sodium: 136 mmol/L (ref 135–145)

## 2024-11-06 MED ORDER — SENNA 8.6 MG PO TABS
1.0000 | ORAL_TABLET | Freq: Every day | ORAL | Status: DC
  Administered 2024-11-06 – 2024-11-07 (×2): 8.6 mg via ORAL
  Filled 2024-11-06 (×2): qty 1

## 2024-11-06 MED ORDER — INSULIN ASPART 100 UNIT/ML IJ SOLN
0.0000 [IU] | Freq: Three times a day (TID) | INTRAMUSCULAR | Status: DC
  Administered 2024-11-06: 2 [IU] via SUBCUTANEOUS
  Filled 2024-11-06: qty 2

## 2024-11-06 MED ORDER — SODIUM CHLORIDE 0.9% FLUSH: 3.0000 mL | INTRAVENOUS | Status: DC | PRN

## 2024-11-06 MED ORDER — SODIUM CHLORIDE 0.9 % IV SOLN: 250.0000 mL | INTRAVENOUS | Status: AC | PRN

## 2024-11-06 MED ORDER — ~~LOC~~ CARDIAC SURGERY, PATIENT & FAMILY EDUCATION: Freq: Once | Status: AC

## 2024-11-06 MED ORDER — LACTULOSE 10 GM/15ML PO SOLN: 20.0000 g | Freq: Two times a day (BID) | ORAL | Status: DC | PRN

## 2024-11-06 MED ORDER — FUROSEMIDE 40 MG PO TABS
40.0000 mg | ORAL_TABLET | Freq: Every day | ORAL | Status: DC
  Administered 2024-11-06: 40 mg via ORAL
  Filled 2024-11-06: qty 1

## 2024-11-06 MED ORDER — POTASSIUM CHLORIDE CRYS ER 20 MEQ PO TBCR
20.0000 meq | EXTENDED_RELEASE_TABLET | Freq: Every day | ORAL | Status: DC
  Administered 2024-11-06 – 2024-11-08 (×3): 20 meq via ORAL
  Filled 2024-11-06 (×3): qty 1

## 2024-11-06 MED ORDER — ROSUVASTATIN CALCIUM 20 MG PO TABS
40.0000 mg | ORAL_TABLET | Freq: Every day | ORAL | Status: DC
  Administered 2024-11-06 – 2024-11-08 (×3): 40 mg via ORAL
  Filled 2024-11-06 (×3): qty 2

## 2024-11-06 MED ORDER — ENOXAPARIN SODIUM 40 MG/0.4ML IJ SOSY
40.0000 mg | PREFILLED_SYRINGE | INTRAMUSCULAR | Status: DC
  Administered 2024-11-06: 40 mg via SUBCUTANEOUS
  Filled 2024-11-06: qty 0.4

## 2024-11-06 MED ORDER — MAGNESIUM HYDROXIDE 400 MG/5ML PO SUSP: 30.0000 mL | Freq: Every day | ORAL | Status: DC | PRN

## 2024-11-06 MED ORDER — ALUM & MAG HYDROXIDE-SIMETH 200-200-20 MG/5ML PO SUSP: 15.0000 mL | Freq: Four times a day (QID) | ORAL | Status: DC | PRN

## 2024-11-06 MED ORDER — ENOXAPARIN SODIUM 40 MG/0.4ML IJ SOSY: 40.0000 mg | PREFILLED_SYRINGE | INTRAMUSCULAR | Status: DC

## 2024-11-06 MED ORDER — LACTULOSE 10 GM/15ML PO SOLN
20.0000 g | Freq: Once | ORAL | Status: AC
  Administered 2024-11-06: 20 g via ORAL
  Filled 2024-11-06: qty 30

## 2024-11-06 MED ORDER — INSULIN ASPART 100 UNIT/ML IJ SOLN: 0.0000 [IU] | Freq: Three times a day (TID) | INTRAMUSCULAR | Status: DC

## 2024-11-06 MED ORDER — SODIUM CHLORIDE 0.9% FLUSH
3.0000 mL | Freq: Two times a day (BID) | INTRAVENOUS | Status: DC
  Administered 2024-11-06 – 2024-11-08 (×5): 3 mL via INTRAVENOUS

## 2024-11-06 MED ORDER — FENTANYL CITRATE (PF) 50 MCG/ML IJ SOSY: 25.0000 ug | PREFILLED_SYRINGE | Freq: Once | INTRAMUSCULAR | Status: DC

## 2024-11-06 NOTE — Plan of Care (Signed)
  Problem: Activity: Goal: Ability to return to baseline activity level will improve Outcome: Progressing   

## 2024-11-06 NOTE — Progress Notes (Signed)
 NAME:  Jon Terry, MRN:  [id number], DOB:  01-06-1951, LOS: 3 ADMISSION DATE:  11/03/2024, CONSULTATION DATE:  11/04/24 REFERRING MD:  Dr Daniel, CHIEF COMPLAINT:  Chest tightness   History of Present Illness:  73 yo male with known prediabetes, GERD, peyronie disease, nephrolithiasis and anxiety recently evaluated as an outpatient for chest tightness , found to have evidence of CAD on coronary CTA and referred for LHC.  LHC showed 65% proximal left main stenosis, high grade prox stenosis of LAD and Cx. Patient was recommended for CABG and underwent 3 vessel CABG today. Intra-op, patient with large volume urine loss up to 4.5 L with minimal blood loss. Post op, patient returns to ICU, intubated, hypothermic, CI 2.4, CO 4.5, SV 56 and SVR 1102 on vigileo.   Pertinent  Medical History   Past Medical History:  Diagnosis Date   Anxiety and depression    Diverticulosis 09/16/2019   GERD (gastroesophageal reflux disease)    occ   History of kidney stones 09/16/2019   Partially obstructing 6 x 3 mm calculus within the mid left ureter causing mild left hydronephrosis   Peyronie disease    Pre-diabetes    Restless leg syndrome    Sciatica    left and right greater on right   Seasonal allergies    Wears glasses      Significant Hospital Events: Including procedures, antibiotic start and stop dates in addition to other pertinent events   11/04/24 CABG x 3  Interim History / Subjective:  Pain better controlled today, no BM yet since surgery. Eating well.   Objective    Blood pressure 116/77, pulse 92, temperature 100.1 F (37.8 C), temperature source Oral, resp. rate (!) 23, height 6' (1.829 m), weight 71.4 kg, SpO2 95%. CVP:  [4 mmHg-7 mmHg] 7 mmHg CO:  [5.4 L/min-6.1 L/min] 5.4 L/min CI:  [2.9 L/min/m2-3.3 L/min/m2] 2.9 L/min/m2      Intake/Output Summary (Last 24 hours) at 11/06/2024 0753 Last data filed at 11/06/2024 0600 Gross per 24 hour  Intake 177.33 ml  Output 2721 ml  Net  -2543.67 ml   Filed Weights   11/03/24 1013 11/05/24 0530 11/06/24 0500  Weight: 68 kg 73.2 kg 71.4 kg    Examination: General: elderly man sitting up in the chair in NAD HENT: NNC/AT, eyes anicteric Lungs: breathing comfortably on RA, CTAB Cardiovascular: s1S2, RRR Abdomen: soft,NT extremities: no significant edema Neuro: awake, alert, answering questions appropriately  I/O -2.9L Chest tube 455cc  Potassium 4 BUN 12 Cr 0.85 WBC 13.7 H/H 10.1/30.8 Platelets 165 CXR personally reviewed> tiny left effusion, RIJ CVC. Wires,    Resolved problem list   Assessment and Plan   Unstable angina with severe left main, LCx, LAD disease S/p CABG x 3- LIMA-LAD, SVG-D1, SVG-OM2 -post op care per TCTS; pulling chest tubes today -pain control per protocol- tramadol, oxy, fentanyl  per protocol -gabapentin and robaxin throught today; can reorder tomorrow if he needs it - con't progressing diet and ambulation -metoprolol, rosuvastatin increased, aspirin -lasix 40mg  today  Pre-diabetes -SSI PRN -goal BG 140-180  GERD -PPI  Patient and wife update during rounds. Stable to transfer out of ICU today.   Labs   CBC: Recent Labs  Lab 11/04/24 1540 11/04/24 1650 11/04/24 1930 11/04/24 1935 11/05/24 0318 11/05/24 1730 11/06/24 0553  WBC 11.7*  --   --  11.5* 7.2 10.0 13.7*  HGB 8.2*   < > 6.1* 6.8* 7.9* 8.9* 10.1*  HCT 24.5*   < >  18.0* 20.3* 23.6* 26.6* 30.8*  MCV 90.7  --   --  92.7 90.8 90.2 92.2  PLT 105*  --   --  123* 115* 131* 165   < > = values in this interval not displayed.    Basic Metabolic Panel: Recent Labs  Lab 11/04/24 0409 11/04/24 0812 11/04/24 1242 11/04/24 1317 11/04/24 1930 11/04/24 1935 11/05/24 0318 11/05/24 1730 11/06/24 0553  NA 135   < > 143   < > 147* 143 142 135 136  K 3.5   < > 4.2   < > 3.9 3.8 3.7 3.9 4.0  CL 104   < > 108  --   --  109 111 101 101  CO2 23  --   --   --   --  24 25 26 23   GLUCOSE 102*   < > 131*  --   --  180*  112* 140* 128*  BUN 9   < > 7*  --   --  7* 8 11 12   CREATININE 0.84   < > 0.60*  --   --  0.81 0.84 0.87 0.85  CALCIUM 8.5*  --   --   --   --  7.4* 7.7* 8.5* 8.9  MG  --   --   --   --   --  3.4* 3.0* 2.4  --    < > = values in this interval not displayed.   GFR: Estimated Creatinine Clearance: 78.2 mL/min (by C-G formula based on SCr of 0.85 mg/dL). Recent Labs  Lab 11/04/24 1935 11/05/24 0318 11/05/24 1730 11/06/24 0553  WBC 11.5* 7.2 10.0 13.7*    Liver Function Tests: Recent Labs  Lab 11/04/24 0409  AST 17  ALT 10  ALKPHOS 49  BILITOT 0.5  PROT 5.9*  ALBUMIN 3.3*    Critical care time:       Leita SHAUNNA Gaskins, DO 11/06/24 2:07 PM [facility]  For contact information, see Amion. If no response to pager, please call PCCM consult pager. After hours, 7PM- 7AM, please call Elink.

## 2024-11-06 NOTE — Progress Notes (Signed)
 2 Days Post-Op Procedure(s) (LRB): CORONARY ARTERY BYPASS GRAFTING (CABG) TIMES THREE USING LEFT INTERNAL MAMMARY ARTERY AND ENDOSCOPICALLY HARVESTED RIGHT GREATER SAPHENOUS VEIN (N/A) ECHOCARDIOGRAM, TRANSESOPHAGEAL, INTRAOPERATIVE (N/A) Subjective: Some incisional pain but feels better than yesterday  Objective: Vital signs in last 24 hours: Temp:  [99 F (37.2 C)-100.1 F (37.8 C)] 99.1 F (37.3 C) (11/09 0808) Pulse Rate:  [71-104] 92 (11/09 0700) Cardiac Rhythm: Normal sinus rhythm (11/09 0000) Resp:  [11-32] 23 (11/09 0700) BP: (98-129)/(63-78) 116/77 (11/09 0700) SpO2:  [92 %-97 %] 95 % (11/09 0700) Arterial Line BP: (126-130)/(52-53) 126/53 (11/08 1000) Weight:  [71.4 kg] 71.4 kg (11/09 0500)  Hemodynamic parameters for last 24 hours: CVP:  [7 mmHg] 7 mmHg CO:  [5.4 L/min-5.5 L/min] 5.4 L/min CI:  [2.9 L/min/m2] 2.9 L/min/m2  Intake/Output from previous day: 11/08 0701 - 11/09 0700 In: 177.3 [P.O.:120; I.V.:29.1; IV Piggyback:28.2] Out: 3046 [Urine:2591; Chest Tube:455] Intake/Output this shift: No intake/output data recorded.  General appearance: alert, cooperative, and no distress Neurologic: intact Heart: regular rate and rhythm Lungs: clear to auscultation bilaterally Abdomen: normal findings: soft, non-tender  Lab Results: Recent Labs    11/05/24 1730 11/06/24 0553  WBC 10.0 13.7*  HGB 8.9* 10.1*  HCT 26.6* 30.8*  PLT 131* 165   BMET:  Recent Labs    11/05/24 1730 11/06/24 0553  NA 135 136  K 3.9 4.0  CL 101 101  CO2 26 23  GLUCOSE 140* 128*  BUN 11 12  CREATININE 0.87 0.85  CALCIUM 8.5* 8.9    PT/INR:  Recent Labs    11/04/24 1359  LABPROT 19.2*  INR 1.5*   ABG    Component Value Date/Time   PHART 7.319 (L) 11/04/2024 1930   HCO3 24.8 11/04/2024 1930   TCO2 26 11/04/2024 1930   ACIDBASEDEF 1.0 11/04/2024 1930   O2SAT 98 11/04/2024 1930   CBG (last 3)  Recent Labs    11/06/24 0024 11/06/24 0446 11/06/24 0700  GLUCAP 118*  115* 132*    Assessment/Plan: S/P Procedure(s) (LRB): CORONARY ARTERY BYPASS GRAFTING (CABG) TIMES THREE USING LEFT INTERNAL MAMMARY ARTERY AND ENDOSCOPICALLY HARVESTED RIGHT GREATER SAPHENOUS VEIN (N/A) ECHOCARDIOGRAM, TRANSESOPHAGEAL, INTRAOPERATIVE (N/A) POD # 2 NEURO- intact CV- in Sr with normal BP  Dc pacing wires  ASA, beta blocker, rosuvastatin RESP- IS RENAL- creatinine and lytes Ok  PO Lasix + K ENDO_ CBG mildly elevated  Change SSi to AC and HS GI- regular diet Anemia- hgb 10 SCD + enoxaparin for DVT prophylaxis Dc chest tubes Cardiac rehab Transfer to 4E   LOS: 3 days    Jon Terry 11/06/2024

## 2024-11-06 NOTE — Plan of Care (Signed)
  Problem: Education: Goal: Understanding of CV disease, CV risk reduction, and recovery process will improve Outcome: Progressing Goal: Individualized Educational Video(s) Outcome: Progressing   Problem: Activity: Goal: Ability to return to baseline activity level will improve Outcome: Progressing   Problem: Cardiovascular: Goal: Ability to achieve and maintain adequate cardiovascular perfusion will improve Outcome: Progressing Goal: Vascular access site(s) Level 0-1 will be maintained Outcome: Progressing   Problem: Health Behavior/Discharge Planning: Goal: Ability to safely manage health-related needs after discharge will improve Outcome: Progressing   Problem: Education: Goal: Knowledge of General Education information will improve Description: Including pain rating scale, medication(s)/side effects and non-pharmacologic comfort measures Outcome: Progressing   Problem: Health Behavior/Discharge Planning: Goal: Ability to manage health-related needs will improve Outcome: Progressing   Problem: Clinical Measurements: Goal: Ability to maintain clinical measurements within normal limits will improve Outcome: Progressing Goal: Will remain free from infection Outcome: Progressing Goal: Diagnostic test results will improve Outcome: Progressing Goal: Respiratory complications will improve Outcome: Progressing Goal: Cardiovascular complication will be avoided Outcome: Progressing   Problem: Activity: Goal: Risk for activity intolerance will decrease Outcome: Progressing   Problem: Nutrition: Goal: Adequate nutrition will be maintained Outcome: Progressing   Problem: Coping: Goal: Level of anxiety will decrease Outcome: Progressing   Problem: Elimination: Goal: Will not experience complications related to bowel motility Outcome: Progressing Goal: Will not experience complications related to urinary retention Outcome: Progressing   Problem: Pain Managment: Goal:  General experience of comfort will improve and/or be controlled Outcome: Progressing   Problem: Safety: Goal: Ability to remain free from injury will improve Outcome: Progressing   Problem: Skin Integrity: Goal: Risk for impaired skin integrity will decrease Outcome: Progressing   Problem: Education: Goal: Will demonstrate proper wound care and an understanding of methods to prevent future damage Outcome: Progressing Goal: Knowledge of disease or condition will improve Outcome: Progressing Goal: Knowledge of the prescribed therapeutic regimen will improve Outcome: Progressing Goal: Individualized Educational Video(s) Outcome: Progressing   Problem: Activity: Goal: Risk for activity intolerance will decrease Outcome: Progressing   Problem: Cardiac: Goal: Will achieve and/or maintain hemodynamic stability Outcome: Progressing   Problem: Clinical Measurements: Goal: Postoperative complications will be avoided or minimized Outcome: Progressing   Problem: Respiratory: Goal: Respiratory status will improve Outcome: Progressing   Problem: Skin Integrity: Goal: Wound healing without signs and symptoms of infection Outcome: Progressing Goal: Risk for impaired skin integrity will decrease Outcome: Progressing   Problem: Urinary Elimination: Goal: Ability to achieve and maintain adequate renal perfusion and functioning will improve Outcome: Progressing

## 2024-11-07 ENCOUNTER — Encounter (HOSPITAL_COMMUNITY): Payer: Self-pay

## 2024-11-07 ENCOUNTER — Inpatient Hospital Stay (HOSPITAL_COMMUNITY)

## 2024-11-07 LAB — BPAM RBC
Blood Product Expiration Date: 202511302359
Blood Product Expiration Date: 202512042359
ISSUE DATE / TIME: 202511072046
Unit Type and Rh: 8400
Unit Type and Rh: 8400

## 2024-11-07 LAB — TYPE AND SCREEN
ABO/RH(D): AB POS
Antibody Screen: NEGATIVE
Unit division: 0
Unit division: 0

## 2024-11-07 LAB — CBC
HCT: 27.2 % — ABNORMAL LOW (ref 39.0–52.0)
Hemoglobin: 9.2 g/dL — ABNORMAL LOW (ref 13.0–17.0)
MCH: 30.3 pg (ref 26.0–34.0)
MCHC: 33.8 g/dL (ref 30.0–36.0)
MCV: 89.5 fL (ref 80.0–100.0)
Platelets: 133 10*3/uL — ABNORMAL LOW (ref 150–400)
RBC: 3.04 MIL/uL — ABNORMAL LOW (ref 4.22–5.81)
RDW: 14.3 % (ref 11.5–15.5)
WBC: 9.6 10*3/uL (ref 4.0–10.5)
nRBC: 0 % (ref 0.0–0.2)

## 2024-11-07 LAB — BASIC METABOLIC PANEL WITH GFR
Anion gap: 7 (ref 5–15)
BUN: 13 mg/dL (ref 8–23)
CO2: 26 mmol/L (ref 22–32)
Calcium: 8.6 mg/dL — ABNORMAL LOW (ref 8.9–10.3)
Chloride: 105 mmol/L (ref 98–111)
Creatinine, Ser: 0.85 mg/dL (ref 0.61–1.24)
GFR, Estimated: >60 mL/min (ref >60)
Glucose, Bld: 105 mg/dL — ABNORMAL HIGH (ref 70–99)
Potassium: 3.9 mmol/L (ref 3.5–5.1)
Sodium: 138 mmol/L (ref 135–145)

## 2024-11-07 LAB — GLUCOSE, CAPILLARY
Glucose-Capillary: 106 mg/dL — ABNORMAL HIGH (ref 70–99)
Glucose-Capillary: 101 mg/dL — ABNORMAL HIGH (ref 70–99)
Glucose-Capillary: 132 mg/dL — ABNORMAL HIGH (ref 70–99)
Glucose-Capillary: 107 mg/dL — ABNORMAL HIGH (ref 70–99)

## 2024-11-07 MED ORDER — METOPROLOL TARTRATE 25 MG PO TABS
25.0000 mg | ORAL_TABLET | Freq: Two times a day (BID) | ORAL | Status: DC
  Administered 2024-11-07 – 2024-11-08 (×3): 25 mg via ORAL
  Filled 2024-11-07 (×3): qty 1

## 2024-11-07 MED ORDER — FUROSEMIDE 20 MG PO TABS: 20.0000 mg | ORAL_TABLET | Freq: Every day | ORAL | Status: DC

## 2024-11-07 MED ORDER — METOPROLOL TARTRATE 25 MG/10 ML ORAL SUSPENSION: 12.5000 mg | Freq: Two times a day (BID) | ORAL | Status: DC

## 2024-11-07 NOTE — Progress Notes (Addendum)
 3 Days Post-Op Procedure(s) (LRB): CORONARY ARTERY BYPASS GRAFTING (CABG) TIMES THREE USING LEFT INTERNAL MAMMARY ARTERY AND ENDOSCOPICALLY HARVESTED RIGHT GREATER SAPHENOUS VEIN (N/A) ECHOCARDIOGRAM, TRANSESOPHAGEAL, INTRAOPERATIVE (N/A) Subjective: Doing well this morning Had multiple BMs yesterday Heart rate a little fast but sinus Basically down to pre-op weight  Objective: Vital signs in last 24 hours: BP 116/77 (BP Location: Right Arm)   Pulse 91   Temp 98.5 F (36.9 C) (Oral)   Resp 16   Ht 6' (1.829 m)   Wt 68.9 kg   SpO2 98%   BMI 20.60 kg/m  Filed Weights   11/05/24 0530 11/06/24 0500 11/07/24 0451  Weight: 73.2 kg 71.4 kg 68.9 kg    Hemodynamic parameters for last 24 hours:    Intake/Output from previous day: 11/09 0701 - 11/10 0700 In: 240 [P.O.:240] Out: 850 [Urine:850] Intake/Output this shift: No intake/output data recorded.  Physical Exam: General - Resting comfortably in bed CV - RRR Resp - Unlabored on RA Abd - Soft, ND/NT Ext - No extremity edema; moderate amount of bruising throughout the right leg but non-tender  Lab Results:    Latest Ref Rng & Units 11/07/2024    3:11 AM 11/06/2024    5:53 AM 11/05/2024    5:30 PM  CBC  WBC 4.0 - 10.5 K/uL 9.6  13.7  10.0   Hemoglobin 13.0 - 17.0 g/dL 9.2  89.8  8.9   Hematocrit 39.0 - 52.0 % 27.2  30.8  26.6   Platelets 150 - 400 K/uL 133  165  131       Latest Ref Rng & Units 11/07/2024    3:11 AM 11/06/2024    5:53 AM 11/05/2024    5:30 PM  CMP  Glucose 70 - 99 mg/dL 894  871  859   BUN 8 - 23 mg/dL 13  12  11    Creatinine 0.61 - 1.24 mg/dL 9.14  9.14  9.12   Sodium 135 - 145 mmol/L 138  136  135   Potassium 3.5 - 5.1 mmol/L 3.9  4.0  3.9   Chloride 98 - 111 mmol/L 105  101  101   CO2 22 - 32 mmol/L 26  23  26    Calcium 8.9 - 10.3 mg/dL 8.6  8.9  8.5     CXR: No pneumothorax, small effusion  Assessment/Plan: S/P Procedure(s) (LRB): CORONARY ARTERY BYPASS GRAFTING (CABG) TIMES THREE  USING LEFT INTERNAL MAMMARY ARTERY AND ENDOSCOPICALLY HARVESTED RIGHT GREATER SAPHENOUS VEIN (N/A) ECHOCARDIOGRAM, TRANSESOPHAGEAL, INTRAOPERATIVE (N/A) POD4 s/p CABG x 3 NEURO- intact  Pain control PRN CV- in SR around 90 bpm             Will increase metop to 25 mg BID for HR control RESP- Continued improved lung aeration             Continue IS, pulm hygiene, ambulation RENAL- creatinine and lytes Ok  Weight 152 lbs up from 150 preop             Hold lasix for today GI- tolerating regular diet  BM: Yesterday Endo- BG well controlled Discontinue ISS and accuchecks ID- NAI DVT ppx - SCD + hold lovenox given leg ecchymosis  Dispo: Floor, likely home tomorrow   LOS: 4 days    Con RAMAN Rafael Salway 11/07/2024

## 2024-11-08 ENCOUNTER — Other Ambulatory Visit: Payer: Self-pay | Admitting: Physician Assistant

## 2024-11-08 LAB — GLUCOSE, CAPILLARY: Glucose-Capillary: 95 mg/dL (ref 70–99)

## 2024-11-08 MED ORDER — METOPROLOL TARTRATE 25 MG PO TABS: 25.0000 mg | ORAL_TABLET | Freq: Two times a day (BID) | ORAL | 1 refills | Status: DC

## 2024-11-08 MED ORDER — OXYCODONE HCL 5 MG PO TABS
5.0000 mg | ORAL_TABLET | Freq: Four times a day (QID) | ORAL | 0 refills | Status: DC | PRN
Start: 2024-11-08 — End: 2024-11-17

## 2024-11-08 MED ORDER — ROSUVASTATIN CALCIUM 40 MG PO TABS: 40.0000 mg | ORAL_TABLET | Freq: Every day | ORAL | 2 refills | Status: AC

## 2024-11-08 MED ORDER — ASPIRIN 325 MG PO TBEC: 325.0000 mg | DELAYED_RELEASE_TABLET | Freq: Every day | ORAL | Status: AC

## 2024-11-08 MED FILL — henylephrine-NaCl Pref Syr 0.8 MG/10ML-0.9% (80 MCG/ML): INTRAVENOUS | Qty: 50 | Status: AC

## 2024-11-08 MED FILL — Electrolyte-R (PH 7.4) Solution: INTRAVENOUS | Qty: 4000 | Status: AC

## 2024-11-08 MED FILL — Albumin, Human Inj 5%: INTRAVENOUS | Qty: 250 | Status: AC

## 2024-11-08 MED FILL — Heparin Sodium (Porcine) Inj 1000 Unit/ML: INTRAMUSCULAR | Qty: 20 | Status: AC

## 2024-11-08 MED FILL — Sodium Bicarbonate IV Soln 8.4%: INTRAVENOUS | Qty: 50 | Status: AC

## 2024-11-08 MED FILL — Heparin Sodium (Porcine) Inj 1000 Unit/ML: Qty: 1000 | Status: AC

## 2024-11-08 MED FILL — Lidocaine HCl Local Preservative Free (PF) Inj 2%: INTRAMUSCULAR | Qty: 14 | Status: AC

## 2024-11-08 MED FILL — Potassium Chloride Inj 2 mEq/ML: INTRAVENOUS | Qty: 40 | Status: AC

## 2024-11-08 MED FILL — Calcium Chloride Inj 10%: INTRAVENOUS | Qty: 10 | Status: AC

## 2024-11-08 MED FILL — Mannitol IV Soln 20%: INTRAVENOUS | Qty: 500 | Status: AC

## 2024-11-08 MED FILL — Sodium Chloride IV Soln 0.9%: INTRAVENOUS | Qty: 2000 | Status: AC

## 2024-11-08 NOTE — Progress Notes (Signed)
   [address], [place] [address]             [phone]  4 Days Post-Op Procedure(s) (LRB): CORONARY ARTERY BYPASS GRAFTING (CABG) TIMES THREE USING LEFT INTERNAL MAMMARY ARTERY AND ENDOSCOPICALLY HARVESTED RIGHT GREATER SAPHENOUS VEIN (N/A) ECHOCARDIOGRAM, TRANSESOPHAGEAL, INTRAOPERATIVE (N/A) Subjective: Good day yesterday, no new concerns.  Feels he is ready to return home.   Objective: Vital signs in last 24 hours: Temp:  [98.1 F (36.7 C)-99.1 F (37.3 C)] 98.4 F (36.9 C) (11/11 0612) Pulse Rate:  [80-102] 87 (11/11 0612) Cardiac Rhythm: Normal sinus rhythm (11/10 2100) Resp:  [18-20] 20 (11/11 0612) BP: (110-131)/(63-75) 131/69 (11/11 0612) SpO2:  [96 %-100 %] 96 % (11/11 0612) Weight:  [67.6 kg] 67.6 kg (11/11 0610)  Intake/Output from previous day: 11/10 0701 - 11/11 0700 In: 240 [P.O.:240] Out: -  Intake/Output this shift: No intake/output data recorded.  General appearance: alert, cooperative, and no distress Neurologic: intact Heart: RRR, no arrhythmias on monitor review Lungs: normal erspiratory effort on RA, stable sats Extremities: bruising right thigh, no palpable hematoma Wound: the sternotomy incision is intact and dry  Lab Results: Recent Labs    11/06/24 0553 11/07/24 0311  WBC 13.7* 9.6  HGB 10.1* 9.2*  HCT 30.8* 27.2*  PLT 165 133*   BMET:  Recent Labs    11/06/24 0553 11/07/24 0311  NA 136 138  K 4.0 3.9  CL 101 105  CO2 23 26  GLUCOSE 128* 105*  BUN 12 13  CREATININE 0.85 0.85  CALCIUM 8.9 8.6*    PT/INR: No results for input(s): LABPROT, INR in the last 72 hours. ABG    Component Value Date/Time   PHART 7.319 (L) 11/04/2024 1930   HCO3 24.8 11/04/2024 1930   TCO2 26 11/04/2024 1930   ACIDBASEDEF 1.0 11/04/2024 1930   O2SAT 98 11/04/2024 1930   CBG (last 3)  Recent Labs    11/07/24 1628 11/07/24 2112 11/08/24 0608  GLUCAP 132* 107* 95    Assessment/Plan: S/P Procedure(s)  (LRB): CORONARY ARTERY BYPASS GRAFTING (CABG) TIMES THREE USING LEFT INTERNAL MAMMARY ARTERY AND ENDOSCOPICALLY HARVESTED RIGHT GREATER SAPHENOUS VEIN (N/A) ECHOCARDIOGRAM, TRANSESOPHAGEAL, INTRAOPERATIVE (N/A)  -POD4 CABG x 3 for left mainand multi-vessel coronary artery disease. On AA, Crestor, metoprolol. Stable VS and cardiac rhythm, independent with mobility.  -PULM- oxygenating normally on RA  -GI- tolerating diet, having bowel movements.   -RENAL- normal function, Wt near baseline and no sign of volume excess. No further diuresis.   -ENDO- no DM, normal Hgb A1C. CBGs well controlled.   -Dispo- Plan discharge to home today. Instructions given and follow up arranged.    LOS: 5 days    Quinette Hentges G. Sidni Fusco, PA-C 11/08/2024

## 2024-11-08 NOTE — Progress Notes (Signed)
 CARDIAC REHAB PHASE I   Post [facility] education including site care, restrictions, heart healthy diet, sternal precautions, IS use at home, home needs at discharge, exercise guidelines and CRP2 reviewed. All questions and concerns addressed. Will refer to [facility] for CRP2. Plan for discharge home today.  [id number] Jon Asberry Hacking, RN BSN 11/08/2024 9:48 AM

## 2024-11-09 ENCOUNTER — Telehealth (HOSPITAL_COMMUNITY): Payer: Self-pay

## 2024-11-09 NOTE — Telephone Encounter (Signed)
 Pt insurance is active and benefits verified through [facility] Medicare Co-pay 0, DED 0/0 met, out of pocket $3,900/$905.13 met, co-insurance 0%. no pre-authorization required, Passport, 11/09/2024@3 :48 REF# [id number]   TCR/ICR? ICR Visit(date of service)limitation? No limit Can multiple codes be used on the same date of service/visit?(IF ITS A LIMIT) n/a    Is this a lifetime maximum or an annual maximum? annual Has the member used any of these services to date? no Is there a time limit (weeks/months) on start of program and/or program completion? no   Will contact patient to see if he is interested in the Cardiac Rehab Program. If interested, patient will need to complete follow up appt. Once completed, patient will be contacted for scheduling upon review by the RN Navigator.

## 2024-11-09 NOTE — Telephone Encounter (Signed)
 Called patient to see if he is interested in the Cardiac Rehab Program. Patient expressed interest. Explained scheduling process and went over insurance, patient verbalized understanding. Will contact patient for scheduling once f/u has been completed.

## 2024-11-15 ENCOUNTER — Other Ambulatory Visit: Payer: Self-pay

## 2024-11-15 ENCOUNTER — Ambulatory Visit: Admitting: Student

## 2024-11-15 DIAGNOSIS — Z951 Presence of aortocoronary bypass graft: Secondary | ICD-10-CM

## 2024-11-17 ENCOUNTER — Ambulatory Visit

## 2024-11-17 ENCOUNTER — Ambulatory Visit (HOSPITAL_COMMUNITY)
Admission: RE | Admit: 2024-11-17 | Discharge: 2024-11-17 | Disposition: A | Discharge status: Home / Self Care | Source: Ambulatory Visit | Attending: Student in an Organized Health Care Education/Training Program | Admitting: Student in an Organized Health Care Education/Training Program

## 2024-11-17 VITALS — BP 113/73 | HR 79 | Resp 20 | Ht 72.0 in | Wt 145.0 lb

## 2024-11-17 DIAGNOSIS — Z951 Presence of aortocoronary bypass graft: Secondary | ICD-10-CM | POA: Diagnosis present

## 2024-11-17 NOTE — Progress Notes (Signed)
      [address] [place] [address]             [phone]       HPI:  Patient returns for routine postoperative follow-up having undergone CABG x 3 (SVG - OM2, SVG - D1, LIMA - LAD) on 11/7.  He was discharged on POD 5, he is now POD 13.  Since hospital discharge the patient reports poor appetite with some weight loss.  His weight at discharge was 149 lbs, he is 145 lbs today.  Prior to surgery he was on a low oxalate due to a history of kidney stones.  That has significant dietary restrictions which he has liberalized somewhat in the post-operative period.  Otherwise he feels well and is ambulating daily.  He has some swelling of the right leg incision.  Pain medication: Mangeable with tylenol .   Bowel function:  Having daily bowel movements.  Allergies as of 11/17/2024       Reactions   Tamsulosin  Shortness Of Breath   Codeine Hives   Penicillins Hives        Medication List        Accurate as of November 17, 2024 12:21 PM. If you have any questions, ask your nurse or doctor.          STOP taking these medications    oxyCODONE  5 MG immediate release tablet Commonly known as: Oxy IR/ROXICODONE        TAKE these medications    aspirin EC 325 MG tablet Take 1 tablet (325 mg total) by mouth daily.   citalopram 20 MG tablet Commonly known as: CELEXA Take 20 mg by mouth daily.   fluticasone 50 MCG/ACT nasal spray Commonly known as: FLONASE Place 1 spray into both nostrils daily.   metoprolol tartrate 25 MG tablet Commonly known as: LOPRESSOR Take 1 tablet (25 mg total) by mouth 2 (two) times daily.   rosuvastatin 40 MG tablet Commonly known as: CRESTOR Take 1 tablet (40 mg total) by mouth daily.   Systane Ultra PF 0.4-0.3 % Soln Generic drug: Polyethyl Glyc-Propyl Glyc PF Place 1 drop into both eyes daily as needed (Dry eyes).        BP 113/73   Pulse 79   Resp 20   Ht 6' (1.829 m)   Wt 145 lb (65.8 kg)   SpO2 99%  Comment: RA  BMI 19.67 kg/m   Physical Exam: General - Comfortable, thin, no distress CV - RRR, no murmurs Resp - Unlabored on RA, clear breath sounds Abd - Soft, ND/NT Ext - Leg incision with some swelling, but soft and dry, no erythema, tenderness or warmth.  Ecchymosis throughout the leg is much improved.  No edema  Imaging: CXR - Clear lung fields. No pleural effusion or pneumothorax.   Assessment/Plan: Mr. Wilner is a very pleasant 73 year old man, 2 weeks s/p CABG.  He is progressing well.  Encouraged him to continue ambulating, daily showers and increasing PO intake.  He will follow-up in 2 weeks.   Con GORMAN Clunes, MD 12:21 PM 11/17/24

## 2024-11-23 NOTE — Progress Notes (Signed)
 Cardiology Office Note:    Date:  12/01/2024   ID:  Jon Terry, DOB [birth year], MRN [id number]  PCP:  Regino Slater, MD  Cardiologist:  Jerel Balding, MD Cardiology APP:  Auden Wettstein E, PA-C     Referring MD: Regino Slater, MD   Chief Complaint: hospital follow-up of recent CABG  History of Present Illness:    Jon Terry is a 73 y.o. male with a history of CAD s/p recent CABG x3 (LIMA-LAD, SVG-Diag, SVG-OM) on 11/04/2024,  palpitations with short runs of SVT and rare PACs/ PVCs noted on monitor in 09/2024, mild valvular disease (aortic insufficiency and mitral regurgitation), hyperlipidemia, prediabetes, GERD, sciatica, peyronie disease, nephrolithiasis, and anxiety/ depression who presents today for hospital follow-up of recent CABG.   Patient was first seen by me in the [facility] in 09/2024 at which time he reported palpitations lasting for 10 minutes at a time and atypical chest pain. Monitor, Echo, coronary CTA was ordered for further evaluation. Monitor showed 3 short runs of SVT (longest run 17 beats) and rare PACs/ PVCs. Echo showed LVEF of 55-60% with normal wall motion and diastolic parameters, mildly enlarged RV with normal RV function, mild AI, and mild MR. Coronary CTA showed a coronary calcium  score of 83.7 (35th percentile for age and sex) with severe stenosis of proximal Lcx and ostial OM1 as well a moderate stenosis of proximal to mid LAD. This led to a cardiac catheterization which showed 65% stenosis of left main as well as tandem 50% and 90% stenoses of LCx and 75% stenosis of mid LAD. He as admitted and CT surgery consulted. He underwent CABG x3 with LIMA to LAD, SVG to 1st Diag, and SVG to OM on 11/04/2024 with Dr. Daniel. He had expected post-op blood loss anemia and was transfused as well as thrombocytopenia. He was discharged on 11/08/2024.   He has been seen by CT surgery twice already (including record today's visit) and is recovering well.  He is  here today with his wife.  He is overall doing very well.  He reports some right-sided musculoskeletal chest pain that he noticed based on how he was pushing up with his right arm but no real chest pain or angina.  He denies any symptoms like what he was having before surgery.  He reports some very minimal dyspnea with walking that he describes as getting winded but no significant shortness of breath.  No shortness of breath at rest.  No orthopnea or PND. He reports a little swelling are vein graft incision site on right leg but no significant edema. He reports being more aware of his heart rate at night but it does not sound like he is having any significant palpitations.  He states he has felt a little spacey since surgery but no real lightheadedness/dizziness and no syncope.  He had CMET and CBC drawn at PCP's office on 11/05/2024. Renal function and electrolytes were normal. (Cr 0.91, K 4.9). Hemoglobin had improved since discharge but was mildly low at 11.1.  Platelets were elevated at 492 which I suspect is reactive thrombocytosis from surgery.  EKGs/Labs/Other Studies Reviewed:    The following studies were reviewed:  Coronary CTA 10/25/2024: Impressions: 1. Coronary calcium  score of 83.7. This was 35th percentile for age-, sex, and race-matched controls. 2. Normal coronary origin with right dominance. 3. Severe atherosclerosis: 70-99% prox LCx/ostial OM1; 50-69% prox to mid LAD. _______________  Monitor 10/12/2024 to 10/25/2024:   Normal sinus rhythm  3 runs of SVT (PAT with block). longest 17 beats   Otherwise rare PACs and PVCs _______________  Echocardiogram 11/01/2024: Impressions: 1. Left ventricular ejection fraction, by estimation, is 55 to 60%. Left  ventricular ejection fraction by 3D volume is 58 %. The left ventricle has  normal function. The left ventricle has no regional wall motion  abnormalities. Left ventricular diastolic   parameters were normal. The average  left ventricular global longitudinal  strain is -24.2 %. The global longitudinal strain is normal.   2. Right ventricular systolic function is normal. The right ventricular  size is mildly enlarged. There is normal pulmonary artery systolic  pressure. The estimated right ventricular systolic pressure is 17.9 mmHg.   3. The mitral valve is normal in structure. Mild mitral valve  regurgitation. No evidence of mitral stenosis.   4. The aortic valve is normal in structure. Aortic valve regurgitation is  mild. Aortic valve sclerosis/calcification is present, without any  evidence of aortic stenosis. Aortic regurgitation PHT measures 931 msec.   5. The inferior vena cava is normal in size with greater than 50%  respiratory variability, suggesting right atrial pressure of 3 mmHg.  _______________  Left Cardiac Catheterization 11/03/2024: Conclusions: Severe left coronary artery disease, including 60-70% ostial LMCA stenosis (pressure dampening noted with 67F diagnostic catheter), sequential mid LAD stenoses of up to 70-80%, and diffuse disease of the ostial through mid LCx with up to 90% stenosis.  The right coronary artery is without significant disease. Low left ventricular filling pressure (LVEDP 3 mmHg - measured after patient had received intracoronary nitroglycerin ).   Recommendations: Given chest pain at rest prior to catheterization today, we will admit Mr. Lehenbauer for expedited cardiac surgery consultation for CABG. Initiate heparin  infusion 2 hours after TR band has been removed in the setting of unstable angina and severe coronary artery disease. Aggressive secondary prevention of coronary artery disease.  Diagnostic Dominance: Right     EKG:  EKG ordered today.   EKG Interpretation Date/Time:  Thursday December 01 2024 10:46:21 EST Ventricular Rate:  57 PR Interval:  142 QRS Duration:  84 QT Interval:  400 QTC Calculation: 389 R Axis:   76  Text Interpretation: Sinus  bradycardia Non-specific T wave changes No significant changes compared to prior tracings Confirmed by Jadine Patient [phone]) on 12/01/2024 1:03:44 PM    Recent Labs: 11/04/2024: ALT 10; TSH 2.107 11/05/2024: Magnesium  2.4 11/07/2024: BUN 13; Creatinine, Ser 0.85; Hemoglobin 9.2; Platelets 133; Potassium 3.9; Sodium 138  Recent Lipid Panel    Component Value Date/Time   CHOL 135 11/04/2024 0409   TRIG 95 11/04/2024 0409   HDL 38 (L) 11/04/2024 0409   CHOLHDL 3.6 11/04/2024 0409   VLDL 19 11/04/2024 0409   LDLCALC 78 11/04/2024 0409    Physical Exam:    Vital Signs: BP 100/62   Pulse (!) 57   Ht 6' (1.829 m)   Wt 148 lb (67.1 kg)   SpO2 99%   BMI 20.07 kg/m     Wt Readings from Last 3 Encounters:  12/01/24 148 lb (67.1 kg)  12/01/24 149 lb (67.6 kg)  11/17/24 145 lb (65.8 kg)     General: 73 y.o. Caucasian male in no acute distress. HEENT: Normocephalic and atraumatic. Sclera clear.  Neck: Supple. No carotid bruits. No JVD. Heart: RRR. Distinct S1 and S2. No murmurs, gallops, or rubs. Sternotomy and chest tube incisions are healing well. Lungs: No increased work of breathing. Clear to ausculation bilaterally. No wheezes, rhonchi,  or rales.  Extremities: No lower extremity edema.   Skin: Warm and dry. Neuro: No focal deficits. Psych: Normal affect. Responds appropriately.  Assessment:    1. Coronary artery disease involving native coronary artery of native heart without angina pectoris   2. S/P CABG x 3   3. Palpitations   4. Mild aortic insufficiency   5. Mild mitral regurgitation   6. Hyperlipidemia, unspecified hyperlipidemia type   7. Prediabetes   8. Acute blood loss anemia   9. Reactive thrombocytosis     Plan:    CAD s/p CABG S/p recent CABG x3 with LIMA to LAD, SVG to Diag, and SVG to OM on 11/04/2024.  - Recovering well. No angina.  - Continue Aspirin  325mg  daily. Will continue high-dose Aspirin  for a couple of months and then can decrease to low  dose Aspirin .  - Continue Crestor  40mg  daily.  - Patient has PRN sublingual Nitroglycerin  at home but has not needed this.  We reviewed how to take this medicine again.  His BP is soft in the office today at 100/62.  Therefore, I recommended checking his BP before taking Nitroglycerin  and would only take if systolic BP >105.  Palpitations Zio monitor in 09/2024 showed 3 short runs of SVT (longest run 17 beats) and rare PACs/ PVCs.  - No significant palpitations.  - Currently on Lopressor  25mg  twice daily. Will switch to Toprol -XL 25mg  daily. Will decrease dose some due to soft BP.   Mild Aortic Insufficiency Mild Mitral Regurgitation Noted on recent Echo on 11/01/2024.  - Can continue routine monitoring with surveillance Echos per guidelines.   Hyperlipidemia Lipid panel on 11/04/2024 during recent admission: Total Cholesterol 135, Triglycerides 95, HLD 38, LDL 78. LDL goal <70 given CAD.  - Patient was started on Crestor  40mg  daily during recent admission. Continue. - Will repeat lipid panel and CMET in 1 month.   Prediabetes  Hemoglobin A1c 5.3% on 11/04/2024.  - Management per PCP.  Acute Blood Loss Anemia Reactive Thrombocytosis Patient had acute blood loss anemia during recent admission following CABG she did require a transfusion of PRBCs.  He was also noted to have some thrombocytopenia during admission.  Repeat CBC at PCPs office on 11/15/2024 showed improvement of hemoglobin to 11.1 and now elevated platelets of 482 (suspected reactive thrombocytosis). - We will repeat a CBC when he comes back in for fasting labs in 1 month.  Disposition: Follow up in 3 months. He was initially seen by me in the [facility] so will try to have him see MD (Dr. Francyne) at next visit.    Signed, Tymber Stallings E Takeshi Teasdale, PA-C  12/01/2024 1:18 PM    [facility]

## 2024-12-01 ENCOUNTER — Ambulatory Visit

## 2024-12-01 ENCOUNTER — Encounter: Payer: Self-pay | Admitting: Student

## 2024-12-01 ENCOUNTER — Ambulatory Visit: Attending: Student | Admitting: Student

## 2024-12-01 VITALS — BP 107/59 | HR 64 | Resp 20 | Ht 72.0 in | Wt 149.0 lb

## 2024-12-01 VITALS — BP 100/62 | HR 57 | Ht 72.0 in | Wt 148.0 lb

## 2024-12-01 DIAGNOSIS — E785 Hyperlipidemia, unspecified: Secondary | ICD-10-CM

## 2024-12-01 DIAGNOSIS — I340 Nonrheumatic mitral (valve) insufficiency: Secondary | ICD-10-CM

## 2024-12-01 DIAGNOSIS — I351 Nonrheumatic aortic (valve) insufficiency: Secondary | ICD-10-CM | POA: Diagnosis not present

## 2024-12-01 DIAGNOSIS — R002 Palpitations: Secondary | ICD-10-CM | POA: Diagnosis not present

## 2024-12-01 DIAGNOSIS — D75838 Other thrombocytosis: Secondary | ICD-10-CM

## 2024-12-01 DIAGNOSIS — Z951 Presence of aortocoronary bypass graft: Secondary | ICD-10-CM

## 2024-12-01 DIAGNOSIS — I2510 Atherosclerotic heart disease of native coronary artery without angina pectoris: Secondary | ICD-10-CM | POA: Diagnosis not present

## 2024-12-01 DIAGNOSIS — D62 Acute posthemorrhagic anemia: Secondary | ICD-10-CM

## 2024-12-01 DIAGNOSIS — R7303 Prediabetes: Secondary | ICD-10-CM

## 2024-12-01 MED ORDER — METOPROLOL SUCCINATE ER 25 MG PO TB24: 25.0000 mg | ORAL_TABLET | Freq: Every day | ORAL | 3 refills | Status: AC

## 2024-12-01 NOTE — Progress Notes (Signed)
      [address] [place] [address]             [phone]       HPI:  Patient returns for routine postoperative follow-up having undergone CABG x 3 (SVG - OM2, SVG - D1, LIMA - LAD) on 11/7.  He was discharged on POD 5, he is now about a month post-op.  At his last visit he was 145 lbs, he is 149 lbs today.  His appetite is improving a little bit.  He has been walking daily and going a little farther each time.  At his last visit, he had some swelling of his right calf incision which has resolved, as has the ecchymosis in his leg.  Allergies as of 12/01/2024       Reactions   Tamsulosin  Shortness Of Breath   Codeine Hives   Penicillins Hives        Medication List        Accurate as of December 01, 2024  9:45 AM. If you have any questions, ask your nurse or doctor.          aspirin  EC 325 MG tablet Take 1 tablet (325 mg total) by mouth daily.   citalopram  20 MG tablet Commonly known as: CELEXA  Take 20 mg by mouth daily.   fluticasone  50 MCG/ACT nasal spray Commonly known as: FLONASE  Place 1 spray into both nostrils daily.   metoprolol  tartrate 25 MG tablet Commonly known as: LOPRESSOR  Take 1 tablet (25 mg total) by mouth 2 (two) times daily.   rosuvastatin  40 MG tablet Commonly known as: CRESTOR  Take 1 tablet (40 mg total) by mouth daily.   Systane Ultra PF 0.4-0.3 % Soln Generic drug: Polyethyl Glyc-Propyl Glyc PF Place 1 drop into both eyes daily as needed (Dry eyes).        BP (!) 107/59   Pulse 64   Resp 20   Ht 6' (1.829 m)   Wt 149 lb (67.6 kg)   SpO2 97%   BMI 20.21 kg/m   Physical Exam: General - Comfortable, thin, no distress CV - RRR, no murmurs. Sternotomy incision CDI, sternum feels stable. Resp - Unlabored on RA, clear breath sounds Abd - Soft, ND/NT Ext - Leg incision healing well. No drainage, incision is CDI.  No leg swelling.  Imaging: No new imaging  Assessment/Plan: Jon Terry is a very  pleasant 73 year old man, 4 weeks s/p CABG.  He is progressing well.  Advised him to discuss switching Metop tartrate to succinate with his cardiologist. Otherwise he is cleared to drive and slowly start increasing lifting amount.  Will refer to cardiac rehab.  Return to clinic as needed.  Con GORMAN Clunes, MD 9:45 AM 12/01/24

## 2024-12-01 NOTE — Patient Instructions (Addendum)
 Medication Instructions:  STOP: Metoprolol  Tartrate  START: Metoprolol  Succinate 25 mg (1 tablet) daily  *If you need a refill on your cardiac medications before your next appointment, please call your pharmacy*  Lab Work: In 1 Month: CMET, CBC, Lipids  If you have labs (blood work) drawn today and your tests are completely normal, you will receive your results only by: MyChart Message (if you have MyChart) OR A paper copy in the mail If you have any lab test that is abnormal or we need to change your treatment, we will call you to review the results.  Testing/Procedures: NONE  Follow-Up: At [facility], you and your health needs are our priority.  As part of our continuing mission to provide you with exceptional heart care, our providers are all part of one team.  This team includes your primary Cardiologist (physician) and Advanced Practice Providers or APPs (Physician Assistants and Nurse Practitioners) who all work together to provide you with the care you need, when you need it.  Your next appointment:   3 month(s)  Provider:   Dr. Francyne or Callie Goodrich, PA-C  We recommend signing up for the patient portal called MyChart.  Sign up information is provided on this After Visit Summary.  MyChart is used to connect with patients for Virtual Visits (Telemedicine).  Patients are able to view lab/test results, encounter notes, upcoming appointments, etc.  Non-urgent messages can be sent to your provider as well.   To learn more about what you can do with MyChart, go to [email or web address].   Other Instructions If you need to take your Nitroglycerin , take your blood pressure. If the diastolic (bottom number) is below 105 do not take the Nitroglycerin .

## 2025-01-06 ENCOUNTER — Telehealth (HOSPITAL_COMMUNITY): Payer: Self-pay

## 2025-01-06 LAB — COMPREHENSIVE METABOLIC PANEL WITH GFR
ALT: 7 IU/L (ref 0–44)
AST: 17 IU/L (ref 0–40)
Albumin: 3.9 g/dL (ref 3.8–4.8)
Alkaline Phosphatase: 86 IU/L (ref 47–123)
BUN/Creatinine Ratio: 19 (ref 10–24)
BUN: 15 mg/dL (ref 8–27)
Bilirubin Total: 0.3 mg/dL (ref 0.0–1.2)
CO2: 26 mmol/L (ref 20–29)
Calcium: 9.6 mg/dL (ref 8.6–10.2)
Chloride: 102 mmol/L (ref 96–106)
Creatinine, Ser: 0.81 mg/dL (ref 0.76–1.27)
Globulin, Total: 2.4 g/dL (ref 1.5–4.5)
Glucose: 93 mg/dL (ref 70–99)
Potassium: 4.3 mmol/L (ref 3.5–5.2)
Sodium: 139 mmol/L (ref 134–144)
Total Protein: 6.3 g/dL (ref 6.0–8.5)
eGFR: 93 mL/min/1.73

## 2025-01-06 LAB — CBC
Hematocrit: 40 % (ref 37.5–51.0)
Hemoglobin: 12.5 g/dL — ABNORMAL LOW (ref 13.0–17.7)
MCH: 28 pg (ref 26.6–33.0)
MCHC: 31.3 g/dL — ABNORMAL LOW (ref 31.5–35.7)
MCV: 90 fL (ref 79–97)
Platelets: 220 x10E3/uL (ref 150–450)
RBC: 4.46 x10E6/uL (ref 4.14–5.80)
RDW: 12.4 % (ref 11.6–15.4)
WBC: 5.7 x10E3/uL (ref 3.4–10.8)

## 2025-01-06 LAB — LIPID PANEL
Chol/HDL Ratio: 2.5 ratio (ref 0.0–5.0)
Cholesterol, Total: 81 mg/dL — ABNORMAL LOW (ref 100–199)
HDL: 33 mg/dL — ABNORMAL LOW
LDL Chol Calc (NIH): 34 mg/dL (ref 0–99)
Triglycerides: 61 mg/dL (ref 0–149)
VLDL Cholesterol Cal: 14 mg/dL (ref 5–40)

## 2025-01-06 NOTE — Telephone Encounter (Signed)
 Patient called back to get scheduled in the Cardiac Rehab Program. Patient will come in for orientation on 1/20 and will attend the 11:45 exercise class.  Sent MyChart message.

## 2025-01-06 NOTE — Telephone Encounter (Signed)
 [facility] Medicare. Pt insurance is active and benefits verified through [facility] Medicare Co-pay 0, DED 0/0 met, out of pocket 4200.00/0 met, co-insurance 0. All severices covered at 100%. No pre-authorization required. Rep Name Marland HERO 01/23/2025@10 :62, REF# [id number]   TCR/ICR? ICR Visit(date of service)limitation? No Can multiple codes be used on the same date of service/visit?(IF ITS A LIMIT)          Is this a lifetime maximum or an annual maximum? 0 Has the member used any of these services to date? 0 Is there a time limit (weeks/months) on start of program and/or program completion? No

## 2025-01-07 ENCOUNTER — Ambulatory Visit: Payer: Self-pay | Admitting: Student

## 2025-01-13 ENCOUNTER — Telehealth (HOSPITAL_COMMUNITY): Payer: Self-pay

## 2025-01-13 NOTE — Telephone Encounter (Signed)
 Confirmed cardiac orientation appointment time of 01/17/25 at 1315.  Pt instructed on location, what to bring, footwear, and approximate length of orientation, and cardiac health history completed.

## 2025-01-17 ENCOUNTER — Encounter (HOSPITAL_COMMUNITY)
Admission: RE | Admit: 2025-01-17 | Discharge: 2025-01-17 | Disposition: A | Discharge status: Home / Self Care | Source: Ambulatory Visit | Attending: Cardiovascular Disease | Admitting: Cardiovascular Disease

## 2025-01-17 VITALS — BP 102/70 | HR 65 | Ht 73.0 in | Wt 151.7 lb

## 2025-01-17 DIAGNOSIS — Z951 Presence of aortocoronary bypass graft: Secondary | ICD-10-CM | POA: Insufficient documentation

## 2025-01-17 NOTE — Progress Notes (Signed)
 Cardiac Rehab Medication Review   Does the patient  feel that his/her medications are working for him/her?  yes  Has the patient been experiencing any side effects to the medications prescribed?  no  Does the patient measure his/her own blood pressure or blood glucose at home?  yes   Does the patient have any problems obtaining medications due to transportation or finances?   no  Understanding of regimen: good Understanding of indications: good Potential of compliance: good    Comments: No questions regarding you medications.    Alm Parkins 01/17/2025 1:19 PM

## 2025-01-17 NOTE — Progress Notes (Signed)
 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  Patient's Home Medications on Admission: Current Medications[1]  Past Medical History: Past Medical History:  Diagnosis Date   Anxiety and depression    Diverticulosis 09/16/2019   GERD (gastroesophageal reflux disease)    occ   History of kidney stones 09/16/2019   Partially obstructing 6 x 3 mm calculus within the mid left ureter causing mild left hydronephrosis   Peyronie disease    Pre-diabetes    Restless leg syndrome    Sciatica    left and right greater on right   Seasonal allergies    Wears glasses     Tobacco Use: Tobacco Use History[2]  Labs: Review Flowsheet       Latest Ref Rng & Units 11/04/2024 01/05/2025  Labs for ITP Cardiac and Pulmonary Rehab  Cholestrol 100 - 199 mg/dL 864  81   LDL (calc) 0 - 99 mg/dL 78  34   HDL-C >60 mg/dL 38  33   Trlycerides 0 - 149 mg/dL 95  61   Hemoglobin J8r 4.8 - 5.6 % 5.3  -  PH, Arterial 7.35 - 7.45 7.319  7.310  7.507  7.458  7.392  7.408  7.341  7.432  -  PCO2 arterial 32 - 48 mmHg 48.2  38.5  22.0  23.2  34.0  35.4  34.7  33.3  -  Bicarbonate 20.0 - 28.0 mmol/L 24.8  19.4  17.7  16.7  20.7  22.4  18.8  22.2  -  TCO2 22 - 32 mmol/L 26  21  18  17  22  25  23  27  29  20  26  23  23   -  Acid-base deficit 0.0 - 2.0 mmol/L 1.0  6.0  5.0  7.0  4.0  2.0  6.0  1.0  -  O2 Saturation % 98  99  100  99  100  100  100  100  -    Details       Multiple values from one day are sorted in  reverse-chronological order         Capillary Blood Glucose: Lab Results  Component Value Date   GLUCAP 95 11/08/2024   GLUCAP 107 (H) 11/07/2024   GLUCAP 132 (H) 11/07/2024   GLUCAP 101 (H) 11/07/2024   GLUCAP 106 (H) 11/07/2024     Exercise Target Goals: Exercise Program Goal: Individual exercise prescription set using results from initial 6 min walk test and THRR while considering  patients activity barriers and safety.   Exercise Prescription Goal: Initial exercise prescription builds to 30-45 minutes a day of aerobic activity, 2-3 days per week.  Home exercise guidelines will be given to patient during program as part of exercise prescription that the participant will acknowledge.  Activity Barriers & Risk Stratification:  Activity Barriers & Cardiac Risk Stratification - 01/17/25 1514       Activity Barriers & Cardiac Risk Stratification   Activity Barriers Joint Problems;Arthritis;Balance Concerns;Back Problems;Neck/Spine Problems;Other (comment)    Comments  Sternal Precautions    Cardiac Risk Stratification High          6 Minute Walk:  6 Minute Walk     Row Name 01/17/25 1513         6 Minute Walk   Phase Initial     Distance 1537 feet     Walk Time 6 minutes     # of Rest Breaks 0     MPH 2.91     METS 3.54     RPE 11     Perceived Dyspnea  0     VO2 Peak 12.41     Symptoms No     Resting HR 68 bpm     Resting BP 102/70     Resting Oxygen Saturation  96 %     Exercise Oxygen Saturation  during 6 min walk 99 %     Max Ex. HR 83 bpm     Max Ex. BP 132/70     2 Minute Post BP 112/70        Oxygen Initial Assessment:   Oxygen Re-Evaluation:   Oxygen Discharge (Final Oxygen Re-Evaluation):   Initial Exercise Prescription:  Initial Exercise Prescription - 01/17/25 1300       Date of Initial Exercise RX and Referring Provider   Date 01/17/25    Referring Provider Jerel Balding, MD    Expected Discharge Date 04/12/25       Treadmill   MPH 2.8    Grade 0    Minutes 15    METs 3.5      Arm Ergometer   Level 1    Watts 25    RPM 60    Minutes 15    METs 3.5      Prescription Details   Frequency (times per week) 3    Duration Progress to 30 minutes of continuous aerobic without signs/symptoms of physical distress      Intensity   THRR 40-80% of Max Heartrate 59-118    Ratings of Perceived Exertion 11-13    Perceived Dyspnea 0-4      Progression   Progression Continue progressive overload as per policy without signs/symptoms or physical distress.      Resistance Training   Training Prescription Yes    Weight 3 lbs    Reps 10-15          Perform Capillary Blood Glucose checks as needed.  Exercise Prescription Changes:   Exercise Comments:   Exercise Goals and Review:   Exercise Goals     Row Name 01/17/25 1336             Exercise Goals   Increase Physical Activity Yes       Intervention Provide advice, education, support and counseling about physical activity/exercise needs.;Develop an individualized exercise prescription for aerobic and resistive training based on initial evaluation findings, risk stratification, comorbidities and participant's personal goals.       Expected Outcomes Short Term: Attend rehab on a regular basis to increase amount of physical activity.;Long Term: Exercising regularly at least 3-5 days a week.;Long Term: Add in home exercise to make exercise part of routine and to increase amount of physical activity.       Increase Strength and Stamina Yes       Intervention Provide advice, education, support and counseling about physical activity/exercise needs.;Develop an individualized exercise prescription for aerobic and resistive training based on initial evaluation findings, risk stratification, comorbidities and participant's personal goals.  Expected Outcomes Short Term: Increase workloads from initial exercise prescription for resistance, speed, and  METs.;Short Term: Perform resistance training exercises routinely during rehab and add in resistance training at home;Long Term: Improve cardiorespiratory fitness, muscular endurance and strength as measured by increased METs and functional capacity ( )       Able to understand and use rate of perceived exertion (RPE) scale Yes       Intervention Provide education and explanation on how to use RPE scale       Expected Outcomes Short Term: Able to use RPE daily in rehab to express subjective intensity level;Long Term:  Able to use RPE to guide intensity level when exercising independently       Knowledge and understanding of Target Heart Rate Range (THRR) Yes       Intervention Provide education and explanation of THRR including how the numbers were predicted and where they are located for reference       Expected Outcomes Short Term: Able to state/look up THRR;Long Term: Able to use THRR to govern intensity when exercising independently;Short Term: Able to use daily as guideline for intensity in rehab       Understanding of Exercise Prescription Yes       Intervention Provide education, explanation, and written materials on patient's individual exercise prescription       Expected Outcomes Short Term: Able to explain program exercise prescription;Long Term: Able to explain home exercise prescription to exercise independently          Exercise Goals Re-Evaluation :   Discharge Exercise Prescription (Final Exercise Prescription Changes):   Nutrition:  Target Goals: Understanding of nutrition guidelines, daily intake of sodium 1500mg , cholesterol 200mg , calories 30% from fat and 7% or less from saturated fats, daily to have 5 or more servings of fruits and vegetables.  Biometrics:  Pre Biometrics - 01/17/25 1325       Pre Biometrics   Waist Circumference 34.25 inches    Hip Circumference 38 inches    Waist to Hip Ratio 0.9 %    Triceps Skinfold 9 mm    % Body Fat 19.9 %    Grip  Strength 24 kg    Flexibility 0 in    Single Leg Stand 8.06 seconds           Nutrition Therapy Plan and Nutrition Goals:   Nutrition Assessments:  MEDIFICTS Score Key: >=70 Need to make dietary changes  40-70 Heart Healthy Diet <= 40 Therapeutic Level Cholesterol Diet    Picture Your Plate Scores: <59 Unhealthy dietary pattern with much room for improvement. 41-50 Dietary pattern unlikely to meet recommendations for good health and room for improvement. 51-60 More healthful dietary pattern, with some room for improvement.  >60 Healthy dietary pattern, although there may be some specific behaviors that could be improved.    Nutrition Goals Re-Evaluation:   Nutrition Goals Re-Evaluation:   Nutrition Goals Discharge (Final Nutrition Goals Re-Evaluation):   Psychosocial: Target Goals: Acknowledge presence or absence of significant depression and/or stress, maximize coping skills, provide positive support system. Participant is able to verbalize types and ability to use techniques and skills needed for reducing stress and depression.  Initial Review & Psychosocial Screening:  Initial Psych Review & Screening - 01/17/25 1347       Initial Review   Current issues with Current Sleep Concerns;Current Stress Concerns    Source of Stress Concerns Family;Financial      Family Dynamics   Good Support System? Yes  Pt has spouse for support     Barriers   Psychosocial barriers to participate in program The patient should benefit from training in stress management and relaxation.      Screening Interventions   Interventions Encouraged to exercise    Expected Outcomes Short Term goal: Utilizing psychosocial counselor, staff and physician to assist with identification of specific Stressors or current issues interfering with healing process. Setting desired goal for each stressor or current issue identified.;Long Term Goal: Stressors or current issues are controlled or  eliminated.;Short Term goal: Identification and review with participant of any Quality of Life or Depression concerns found by scoring the questionnaire.;Long Term goal: The participant improves quality of Life and PHQ9 Scores as seen by post scores and/or verbalization of changes          Quality of Life Scores:  Quality of Life - 01/17/25 1520       Quality of Life   Select Quality of Life      Quality of Life Scores   Health/Function Pre 26.33 %    Socioeconomic Pre 28.71 %    Psych/Spiritual Pre 25 %    Family Pre 30 %    GLOBAL Pre 27.15 %         Scores of 19 and below usually indicate a poorer quality of life in these areas.  A difference of  2-3 points is a clinically meaningful difference.  A difference of 2-3 points in the total score of the Quality of Life Index has been associated with significant improvement in overall quality of life, self-image, physical symptoms, and general health in studies assessing change in quality of life.  PHQ-9: Review Flowsheet       01/17/2025  Depression screen PHQ 2/9  Decreased Interest 0  Down, Depressed, Hopeless 0  PHQ - 2 Score 0  Altered sleeping 1  Tired, decreased energy 1  Change in appetite 0  Feeling bad or failure about yourself  0  Trouble concentrating 0  Moving slowly or fidgety/restless 0  Suicidal thoughts 0  PHQ-9 Score 2  Difficult doing work/chores Not difficult at all   Interpretation of Total Score  Total Score Depression Severity:  1-4 = Minimal depression, 5-9 = Mild depression, 10-14 = Moderate depression, 15-19 = Moderately severe depression, 20-27 = Severe depression   Psychosocial Evaluation and Intervention:   Psychosocial Re-Evaluation:   Psychosocial Discharge (Final Psychosocial Re-Evaluation):   Vocational Rehabilitation: Provide vocational rehab assistance to qualifying candidates.   Vocational Rehab Evaluation & Intervention:  Vocational Rehab - 01/17/25 1522       Initial  Vocational Rehab Evaluation & Intervention   Assessment shows need for Vocational Rehabilitation No   Pt is retired         Education: Education Goals: Education classes will be provided on a weekly basis, covering required topics. Participant will state understanding/return demonstration of topics presented.     Core Videos: Exercise    Move It!  Clinical staff conducted group or individual video education with verbal and written material and guidebook.  Patient learns the recommended Pritikin exercise program. Exercise with the goal of living a long, healthy life. Some of the health benefits of exercise include controlled diabetes, healthier blood pressure levels, improved cholesterol levels, improved heart and lung capacity, improved sleep, and better body composition. Everyone should speak with their doctor before starting or changing an exercise routine.  Biomechanical Limitations Clinical staff conducted group or individual video education with verbal and written material  and guidebook.  Patient learns how biomechanical limitations can impact exercise and how we can mitigate and possibly overcome limitations to have an impactful and balanced exercise routine.  Body Composition Clinical staff conducted group or individual video education with verbal and written material and guidebook.  Patient learns that body composition (ratio of muscle mass to fat mass) is a key component to assessing overall fitness, rather than body weight alone. Increased fat mass, especially visceral belly fat, can put us  at increased risk for metabolic syndrome, type 2 diabetes, heart disease, and even death. It is recommended to combine diet and exercise (cardiovascular and resistance training) to improve your body composition. Seek guidance from your physician and exercise physiologist before implementing an exercise routine.  Exercise Action Plan Clinical staff conducted group or individual video education  with verbal and written material and guidebook.  Patient learns the recommended strategies to achieve and enjoy long-term exercise adherence, including variety, self-motivation, self-efficacy, and positive decision making. Benefits of exercise include fitness, good health, weight management, more energy, better sleep, less stress, and overall well-being.  Medical   Heart Disease Risk Reduction Clinical staff conducted group or individual video education with verbal and written material and guidebook.  Patient learns our heart is our most vital organ as it circulates oxygen, nutrients, white blood cells, and hormones throughout the entire body, and carries waste away. Data supports a plant-based eating plan like the Pritikin Program for its effectiveness in slowing progression of and reversing heart disease. The video provides a number of recommendations to address heart disease.   Metabolic Syndrome and Belly Fat  Clinical staff conducted group or individual video education with verbal and written material and guidebook.  Patient learns what metabolic syndrome is, how it leads to heart disease, and how one can reverse it and keep it from coming back. You have metabolic syndrome if you have 3 of the following 5 criteria: abdominal obesity, high blood pressure, high triglycerides, low HDL cholesterol, and high blood sugar.  Hypertension and Heart Disease Clinical staff conducted group or individual video education with verbal and written material and guidebook.  Patient learns that high blood pressure, or hypertension, is very common in the [place] . Hypertension is largely due to excessive salt intake, but other important risk factors include being overweight, physical inactivity, drinking too much alcohol , smoking, and not eating enough potassium from fruits and vegetables. High blood pressure is a leading risk factor for heart attack, stroke, congestive heart failure, dementia, kidney failure,  and premature death. Long-term effects of excessive salt intake include stiffening of the arteries and thickening of heart muscle and organ damage. Recommendations include ways to reduce hypertension and the risk of heart disease.  Diseases of Our Time - Focusing on Diabetes Clinical staff conducted group or individual video education with verbal and written material and guidebook.  Patient learns why the best way to stop diseases of our time is prevention, through food and other lifestyle changes. Medicine (such as prescription pills and surgeries) is often only a Band-Aid on the problem, not a long-term solution. Most common diseases of our time include obesity, type 2 diabetes, hypertension, heart disease, and cancer. The Pritikin Program is recommended and has been proven to help reduce, reverse, and/or prevent the damaging effects of metabolic syndrome.  Nutrition   Overview of the Pritikin Eating Plan  Clinical staff conducted group or individual video education with verbal and written material and guidebook.  Patient learns about the Pritikin Eating Plan for  disease risk reduction. The Pritikin Eating Plan emphasizes a wide variety of unrefined, minimally-processed carbohydrates, like fruits, vegetables, whole grains, and legumes. Go, Caution, and Stop food choices are explained. Plant-based and lean animal proteins are emphasized. Rationale provided for low sodium intake for blood pressure control, low added sugars for blood sugar stabilization, and low added fats and oils for coronary artery disease risk reduction and weight management.  Calorie Density  Clinical staff conducted group or individual video education with verbal and written material and guidebook.  Patient learns about calorie density and how it impacts the Pritikin Eating Plan. Knowing the characteristics of the food you choose will help you decide whether those foods will lead to weight gain or weight loss, and whether you want  to consume more or less of them. Weight loss is usually a side effect of the Pritikin Eating Plan because of its focus on low calorie-dense foods.  Label Reading  Clinical staff conducted group or individual video education with verbal and written material and guidebook.  Patient learns about the Pritikin recommended label reading guidelines and corresponding recommendations regarding calorie density, added sugars, sodium content, and whole grains.  Dining Out - Part 1  Clinical staff conducted group or individual video education with verbal and written material and guidebook.  Patient learns that restaurant meals can be sabotaging because they can be so high in calories, fat, sodium, and/or sugar. Patient learns recommended strategies on how to positively address this and avoid unhealthy pitfalls.  Facts on Fats  Clinical staff conducted group or individual video education with verbal and written material and guidebook.  Patient learns that lifestyle modifications can be just as effective, if not more so, as many medications for lowering your risk of heart disease. A Pritikin lifestyle can help to reduce your risk of inflammation and atherosclerosis (cholesterol build-up, or plaque, in the artery walls). Lifestyle interventions such as dietary choices and physical activity address the cause of atherosclerosis. A review of the types of fats and their impact on blood cholesterol levels, along with dietary recommendations to reduce fat intake is also included.  Nutrition Action Plan  Clinical staff conducted group or individual video education with verbal and written material and guidebook.  Patient learns how to incorporate Pritikin recommendations into their lifestyle. Recommendations include planning and keeping personal health goals in mind as an important part of their success.  Healthy Mind-Set    Healthy Minds, Bodies, Hearts  Clinical staff conducted group or individual video education with  verbal and written material and guidebook.  Patient learns how to identify when they are stressed. Video will discuss the impact of that stress, as well as the many benefits of stress management. Patient will also be introduced to stress management techniques. The way we think, act, and feel has an impact on our hearts.  How Our Thoughts Can Heal Our Hearts  Clinical staff conducted group or individual video education with verbal and written material and guidebook.  Patient learns that negative thoughts can cause depression and anxiety. This can result in negative lifestyle behavior and serious health problems. Cognitive behavioral therapy is an effective method to help control our thoughts in order to change and improve our emotional outlook.  Additional Videos:  Exercise    Improving Performance  Clinical staff conducted group or individual video education with verbal and written material and guidebook.  Patient learns to use a non-linear approach by alternating intensity levels and lengths of time spent exercising to help burn more  calories and lose more body fat. Cardiovascular exercise helps improve heart health, metabolism, hormonal balance, blood sugar control, and recovery from fatigue. Resistance training improves strength, endurance, balance, coordination, reaction time, metabolism, and muscle mass. Flexibility exercise improves circulation, posture, and balance. Seek guidance from your physician and exercise physiologist before implementing an exercise routine and learn your capabilities and proper form for all exercise.  Introduction to Yoga  Clinical staff conducted group or individual video education with verbal and written material and guidebook.  Patient learns about yoga, a discipline of the coming together of mind, breath, and body. The benefits of yoga include improved flexibility, improved range of motion, better posture and core strength, increased lung function, weight loss, and  positive self-image. Yogas heart health benefits include lowered blood pressure, healthier heart rate, decreased cholesterol and triglyceride levels, improved immune function, and reduced stress. Seek guidance from your physician and exercise physiologist before implementing an exercise routine and learn your capabilities and proper form for all exercise.  Medical   Aging: Enhancing Your Quality of Life  Clinical staff conducted group or individual video education with verbal and written material and guidebook.  Patient learns key strategies and recommendations to stay in good physical health and enhance quality of life, such as prevention strategies, having an advocate, securing a Health Care Proxy and Power of Attorney, and keeping a list of medications and system for tracking them. It also discusses how to avoid risk for bone loss.  Biology of Weight Control  Clinical staff conducted group or individual video education with verbal and written material and guidebook.  Patient learns that weight gain occurs because we consume more calories than we burn (eating more, moving less). Even if your body weight is normal, you may have higher ratios of fat compared to muscle mass. Too much body fat puts you at increased risk for cardiovascular disease, heart attack, stroke, type 2 diabetes, and obesity-related cancers. In addition to exercise, following the Pritikin Eating Plan can help reduce your risk.  Decoding Lab Results  Clinical staff conducted group or individual video education with verbal and written material and guidebook.  Patient learns that lab test reflects one measurement whose values change over time and are influenced by many factors, including medication, stress, sleep, exercise, food, hydration, pre-existing medical conditions, and more. It is recommended to use the knowledge from this video to become more involved with your lab results and evaluate your numbers to speak with your  doctor.   Diseases of Our Time - Overview  Clinical staff conducted group or individual video education with verbal and written material and guidebook.  Patient learns that according to the CDC, 50% to 70% of chronic diseases (such as obesity, type 2 diabetes, elevated lipids, hypertension, and heart disease) are avoidable through lifestyle improvements including healthier food choices, listening to satiety cues, and increased physical activity.  Sleep Disorders Clinical staff conducted group or individual video education with verbal and written material and guidebook.  Patient learns how good quality and duration of sleep are important to overall health and well-being. Patient also learns about sleep disorders and how they impact health along with recommendations to address them, including discussing with a physician.  Nutrition  Dining Out - Part 2 Clinical staff conducted group or individual video education with verbal and written material and guidebook.  Patient learns how to plan ahead and communicate in order to maximize their dining experience in a healthy and nutritious manner. Included are recommended food choices based  on the type of restaurant the patient is visiting.   Fueling a Banker conducted group or individual video education with verbal and written material and guidebook.  There is a strong connection between our food choices and our health. Diseases like obesity and type 2 diabetes are very prevalent and are in large-part due to lifestyle choices. The Pritikin Eating Plan provides plenty of food and hunger-curbing satisfaction. It is easy to follow, affordable, and helps reduce health risks.  Menu Workshop  Clinical staff conducted group or individual video education with verbal and written material and guidebook.  Patient learns that restaurant meals can sabotage health goals because they are often packed with calories, fat, sodium, and sugar.  Recommendations include strategies to plan ahead and to communicate with the manager, chef, or server to help order a healthier meal.  Planning Your Eating Strategy  Clinical staff conducted group or individual video education with verbal and written material and guidebook.  Patient learns about the Pritikin Eating Plan and its benefit of reducing the risk of disease. The Pritikin Eating Plan does not focus on calories. Instead, it emphasizes high-quality, nutrient-rich foods. By knowing the characteristics of the foods, we choose, we can determine their calorie density and make informed decisions.  Targeting Your Nutrition Priorities  Clinical staff conducted group or individual video education with verbal and written material and guidebook.  Patient learns that lifestyle habits have a tremendous impact on disease risk and progression. This video provides eating and physical activity recommendations based on your personal health goals, such as reducing LDL cholesterol, losing weight, preventing or controlling type 2 diabetes, and reducing high blood pressure.  Vitamins and Minerals  Clinical staff conducted group or individual video education with verbal and written material and guidebook.  Patient learns different ways to obtain key vitamins and minerals, including through a recommended healthy diet. It is important to discuss all supplements you take with your doctor.   Healthy Mind-Set    Smoking Cessation  Clinical staff conducted group or individual video education with verbal and written material and guidebook.  Patient learns that cigarette smoking and tobacco addiction pose a serious health risk which affects millions of people. Stopping smoking will significantly reduce the risk of heart disease, lung disease, and many forms of cancer. Recommended strategies for quitting are covered, including working with your doctor to develop a successful plan.  Culinary   Becoming a Corporate Investment Banker conducted group or individual video education with verbal and written material and guidebook.  Patient learns that cooking at home can be healthy, cost-effective, quick, and puts them in control. Keys to cooking healthy recipes will include looking at your recipe, assessing your equipment needs, planning ahead, making it simple, choosing cost-effective seasonal ingredients, and limiting the use of added fats, salts, and sugars.  Cooking - Breakfast and Snacks  Clinical staff conducted group or individual video education with verbal and written material and guidebook.  Patient learns how important breakfast is to satiety and nutrition through the entire day. Recommendations include key foods to eat during breakfast to help stabilize blood sugar levels and to prevent overeating at meals later in the day. Planning ahead is also a key component.  Cooking - Educational Psychologist conducted group or individual video education with verbal and written material and guidebook.  Patient learns eating strategies to improve overall health, including an approach to cook more at home. Recommendations include thinking of animal  protein as a side on your plate rather than center stage and focusing instead on lower calorie dense options like vegetables, fruits, whole grains, and plant-based proteins, such as beans. Making sauces in large quantities to freeze for later and leaving the skin on your vegetables are also recommended to maximize your experience.  Cooking - Healthy Salads and Dressing Clinical staff conducted group or individual video education with verbal and written material and guidebook.  Patient learns that vegetables, fruits, whole grains, and legumes are the foundations of the Pritikin Eating Plan. Recommendations include how to incorporate each of these in flavorful and healthy salads, and how to create homemade salad dressings. Proper handling of ingredients is also covered.  Cooking - Soups and [employer] - Soups and Desserts Clinical staff conducted group or individual video education with verbal and written material and guidebook.  Patient learns that Pritikin soups and desserts make for easy, nutritious, and delicious snacks and meal components that are low in sodium, fat, sugar, and calorie density, while high in vitamins, minerals, and filling fiber. Recommendations include simple and healthy ideas for soups and desserts.   Overview     The Pritikin Solution Program Overview Clinical staff conducted group or individual video education with verbal and written material and guidebook.  Patient learns that the results of the Pritikin Program have been documented in more than 100 articles published in peer-reviewed journals, and the benefits include reducing risk factors for (and, in some cases, even reversing) high cholesterol, high blood pressure, type 2 diabetes, obesity, and more! An overview of the three key pillars of the Pritikin Program will be covered: eating well, doing regular exercise, and having a healthy mind-set.  WORKSHOPS  Exercise: Exercise Basics: Building Your Action Plan Clinical staff led group instruction and group discussion with PowerPoint presentation and patient guidebook. To enhance the learning environment the use of posters, models and videos may be added. At the conclusion of this workshop, patients will comprehend the difference between physical activity and exercise, as well as the benefits of incorporating both, into their routine. Patients will understand the FITT (Frequency, Intensity, Time, and Type) principle and how to use it to build an exercise action plan. In addition, safety concerns and other considerations for exercise and cardiac rehab will be addressed by the presenter. The purpose of this lesson is to promote a comprehensive and effective weekly exercise routine in order to improve patients overall level of  fitness.   Managing Heart Disease: Your Path to a Healthier Heart Clinical staff led group instruction and group discussion with PowerPoint presentation and patient guidebook. To enhance the learning environment the use of posters, models and videos may be added.At the conclusion of this workshop, patients will understand the anatomy and physiology of the heart. Additionally, they will understand how Pritikins three pillars impact the risk factors, the progression, and the management of heart disease.  The purpose of this lesson is to provide a high-level overview of the heart, heart disease, and how the Pritikin lifestyle positively impacts risk factors.  Exercise Biomechanics Clinical staff led group instruction and group discussion with PowerPoint presentation and patient guidebook. To enhance the learning environment the use of posters, models and videos may be added. Patients will learn how the structural parts of their bodies function and how these functions impact their daily activities, movement, and exercise. Patients will learn how to promote a neutral spine, learn how to manage pain, and identify ways to improve their physical movement in  order to promote healthy living. The purpose of this lesson is to expose patients to common physical limitations that impact physical activity. Participants will learn practical ways to adapt and manage aches and pains, and to minimize their effect on regular exercise. Patients will learn how to maintain good posture while sitting, walking, and lifting.  Balance Training and Fall Prevention  Clinical staff led group instruction and group discussion with PowerPoint presentation and patient guidebook. To enhance the learning environment the use of posters, models and videos may be added. At the conclusion of this workshop, patients will understand the importance of their sensorimotor skills (vision, proprioception, and the vestibular system)  in maintaining their ability to balance as they age. Patients will apply a variety of balancing exercises that are appropriate for their current level of function. Patients will understand the common causes for poor balance, possible solutions to these problems, and ways to modify their physical environment in order to minimize their fall risk. The purpose of this lesson is to teach patients about the importance of maintaining balance as they age and ways to minimize their risk of falling.  WORKSHOPS   Nutrition:  Fueling a Ship Broker led group instruction and group discussion with PowerPoint presentation and patient guidebook. To enhance the learning environment the use of posters, models and videos may be added. Patients will review the foundational principles of the Pritikin Eating Plan and understand what constitutes a serving size in each of the food groups. Patients will also learn Pritikin-friendly foods that are better choices when away from home and review make-ahead meal and snack options. Calorie density will be reviewed and applied to three nutrition priorities: weight maintenance, weight loss, and weight gain. The purpose of this lesson is to reinforce (in a group setting) the key concepts around what patients are recommended to eat and how to apply these guidelines when away from home by planning and selecting Pritikin-friendly options. Patients will understand how calorie density may be adjusted for different weight management goals.  Mindful Eating  Clinical staff led group instruction and group discussion with PowerPoint presentation and patient guidebook. To enhance the learning environment the use of posters, models and videos may be added. Patients will briefly review the concepts of the Pritikin Eating Plan and the importance of low-calorie dense foods. The concept of mindful eating will be introduced as well as the importance of paying attention to internal hunger  signals. Triggers for non-hunger eating and techniques for dealing with triggers will be explored. The purpose of this lesson is to provide patients with the opportunity to review the basic principles of the Pritikin Eating Plan, discuss the value of eating mindfully and how to measure internal cues of hunger and fullness using the Hunger Scale. Patients will also discuss reasons for non-hunger eating and learn strategies to use for controlling emotional eating.  Targeting Your Nutrition Priorities Clinical staff led group instruction and group discussion with PowerPoint presentation and patient guidebook. To enhance the learning environment the use of posters, models and videos may be added. Patients will learn how to determine their genetic susceptibility to disease by reviewing their family history. Patients will gain insight into the importance of diet as part of an overall healthy lifestyle in mitigating the impact of genetics and other environmental insults. The purpose of this lesson is to provide patients with the opportunity to assess their personal nutrition priorities by looking at their family history, their own health history and current risk factors. Patients  will also be able to discuss ways of prioritizing and modifying the Pritikin Eating Plan for their highest risk areas  Menu  Clinical staff led group instruction and group discussion with PowerPoint presentation and patient guidebook. To enhance the learning environment the use of posters, models and videos may be added. Using menus brought in from [employer], or printed from [employer], patients will apply the Pritikin dining out guidelines that were presented in the [employer] video. Patients will also be able to practice these guidelines in a variety of provided scenarios. The purpose of this lesson is to provide patients with the opportunity to practice hands-on learning of the Pritikin Dining Out guidelines  with actual menus and practice scenarios.  Label Reading Clinical staff led group instruction and group discussion with PowerPoint presentation and patient guidebook. To enhance the learning environment the use of posters, models and videos may be added. Patients will review and discuss the Pritikin label reading guidelines presented in Pritikins Label Reading Educational series video. Using fool labels brought in from local grocery stores and markets, patients will apply the label reading guidelines and determine if the packaged food meet the Pritikin guidelines. The purpose of this lesson is to provide patients with the opportunity to review, discuss, and practice hands-on learning of the Pritikin Label Reading guidelines with actual packaged food labels. Cooking School  Pritikins [employer] are designed to teach patients ways to prepare quick, simple, and affordable recipes at home. The importance of nutritions role in chronic disease risk reduction is reflected in its emphasis in the overall Pritikin program. By learning how to prepare essential core Pritikin Eating Plan recipes, patients will increase control over what they eat; be able to customize the flavor of foods without the use of added salt, sugar, or fat; and improve the quality of the food they consume. By learning a set of core recipes which are easily assembled, quickly prepared, and affordable, patients are more likely to prepare more healthy foods at home. These workshops focus on convenient breakfasts, simple entres, side dishes, and desserts which can be prepared with minimal effort and are consistent with nutrition recommendations for cardiovascular risk reduction. Cooking [employer] are taught by a armed forces logistics/support/administrative officer (RD) who has been trained by the [employer]. The chef or RD has a clear understanding of the importance of minimizing - if not completely eliminating - added fat, sugar, and  sodium in recipes. Throughout the series of Cooking School Workshop sessions, patients will learn about healthy ingredients and efficient methods of cooking to build confidence in their capability to prepare    Cooking School weekly topics:  Adding Flavor- Sodium-Free  Fast and Healthy Breakfasts  Powerhouse Plant-Based Proteins  Satisfying Salads and Dressings  Simple Sides and Sauces  International Cuisine-Spotlight on the [employer] Zones  Delicious Desserts  Savory Soups  [employer] - Meals in a Astronomer Appetizers and Snacks  Comforting Weekend Breakfasts  One-Pot Wonders   Fast Evening Meals  Landscape Architect Your Pritikin Plate  WORKSHOPS   Healthy Mindset (Psychosocial):  Focused Goals, Sustainable Changes Clinical staff led group instruction and group discussion with PowerPoint presentation and patient guidebook. To enhance the learning environment the use of posters, models and videos may be added. Patients will be able to apply effective goal setting strategies to establish at least one personal goal, and then take consistent, meaningful action toward that goal. They will learn to identify  common barriers to achieving personal goals and develop strategies to overcome them. Patients will also gain an understanding of how our mind-set can impact our ability to achieve goals and the importance of cultivating a positive and growth-oriented mind-set. The purpose of this lesson is to provide patients with a deeper understanding of how to set and achieve personal goals, as well as the tools and strategies needed to overcome common obstacles which may arise along the way.  From Head to Heart: The Power of a Healthy Outlook  Clinical staff led group instruction and group discussion with PowerPoint presentation and patient guidebook. To enhance the learning environment the use of posters, models and videos may be added. Patients will be able to recognize and  describe the impact of emotions and mood on physical health. They will discover the importance of self-care and explore self-care practices which may work for them. Patients will also learn how to utilize the 4 Cs to cultivate a healthier outlook and better manage stress and challenges. The purpose of this lesson is to demonstrate to patients how a healthy outlook is an essential part of maintaining good health, especially as they continue their cardiac rehab journey.  Healthy Sleep for a Healthy Heart Clinical staff led group instruction and group discussion with PowerPoint presentation and patient guidebook. To enhance the learning environment the use of posters, models and videos may be added. At the conclusion of this workshop, patients will be able to demonstrate knowledge of the importance of sleep to overall health, well-being, and quality of life. They will understand the symptoms of, and treatments for, common sleep disorders. Patients will also be able to identify daytime and nighttime behaviors which impact sleep, and they will be able to apply these tools to help manage sleep-related challenges. The purpose of this lesson is to provide patients with a general overview of sleep and outline the importance of quality sleep. Patients will learn about a few of the most common sleep disorders. Patients will also be introduced to the concept of sleep hygiene, and discover ways to self-manage certain sleeping problems through simple daily behavior changes. Finally, the workshop will motivate patients by clarifying the links between quality sleep and their goals of heart-healthy living.   Recognizing and Reducing Stress Clinical staff led group instruction and group discussion with PowerPoint presentation and patient guidebook. To enhance the learning environment the use of posters, models and videos may be added. At the conclusion of this workshop, patients will be able to understand the types of stress  reactions, differentiate between acute and chronic stress, and recognize the impact that chronic stress has on their health. They will also be able to apply different coping mechanisms, such as reframing negative self-talk. Patients will have the opportunity to practice a variety of stress management techniques, such as deep abdominal breathing, progressive muscle relaxation, and/or guided imagery.  The purpose of this lesson is to educate patients on the role of stress in their lives and to provide healthy techniques for coping with it.  Learning Barriers/Preferences:  Learning Barriers/Preferences - 01/17/25 1348       Learning Barriers/Preferences   Learning Barriers Sight    Learning Preferences Audio;Computer/Internet;Group Instruction;Individual Instruction;Pictoral;Skilled Demonstration;Verbal Instruction;Video;Written Material          Education Topics:  Knowledge Questionnaire Score:  Knowledge Questionnaire Score - 01/17/25 1521       Knowledge Questionnaire Score   Pre Score 22/24          Core Components/Risk Factors/Patient  Goals at Admission:  Personal Goals and Risk Factors at Admission - 01/17/25 1522       Core Components/Risk Factors/Patient Goals on Admission    Weight Management Weight Maintenance    Hypertension Yes    Intervention Provide education on lifestyle modifcations including regular physical activity/exercise, weight management, moderate sodium restriction and increased consumption of fresh fruit, vegetables, and low fat dairy, alcohol  moderation, and smoking cessation.;Monitor prescription use compliance.    Expected Outcomes Short Term: Continued assessment and intervention until BP is < 140/91mm HG in hypertensive participants. < 130/83mm HG in hypertensive participants with diabetes, heart failure or chronic kidney disease.;Long Term: Maintenance of blood pressure at goal levels.    Lipids Yes    Intervention Provide education and support for  participant on nutrition & aerobic/resistive exercise along with prescribed medications to achieve LDL 70mg , HDL >40mg .    Expected Outcomes Short Term: Participant states understanding of desired cholesterol values and is compliant with medications prescribed. Participant is following exercise prescription and nutrition guidelines.;Long Term: Cholesterol controlled with medications as prescribed, with individualized exercise RX and with personalized nutrition plan. Value goals: LDL < 70mg , HDL > 40 mg.    Stress Yes    Intervention Offer individual and/or small group education and counseling on adjustment to heart disease, stress management and health-related lifestyle change. Teach and support self-help strategies.;Refer participants experiencing significant psychosocial distress to appropriate mental health specialists for further evaluation and treatment. When possible, include family members and significant others in education/counseling sessions.    Expected Outcomes Short Term: Participant demonstrates changes in health-related behavior, relaxation and other stress management skills, ability to obtain effective social support, and compliance with psychotropic medications if prescribed.;Long Term: Emotional wellbeing is indicated by absence of clinically significant psychosocial distress or social isolation.          Core Components/Risk Factors/Patient Goals Review:    Core Components/Risk Factors/Patient Goals at Discharge (Final Review):    ITP Comments:  ITP Comments     Row Name 01/17/25 1512           ITP Comments Wilbert Bihari, MD: Medical Director. Introduction to the [facility] / Intensive Cardiac Rehab. Initial orientation packet reviewed with the patient.          Comments: Participant attended orientation for the cardiac rehabilitation program on  01/17/2025  to perform initial intake and exercise walk test. Patient introduced to the Pritikin Program  education and orientation packet was reviewed. Completed 6-minute walk test, measurements, initial ITP, and exercise prescription. Vital signs stable. Telemetry-normal sinus rhythm, rare PVC, asymptomatic.   Service time was from 1305 to 1453.        [1]  Current Outpatient Medications:    aspirin  EC 325 MG tablet, Take 1 tablet (325 mg total) by mouth daily., Disp: , Rfl:    citalopram  (CELEXA ) 20 MG tablet, Take 20 mg by mouth daily., Disp: , Rfl:    fluticasone  (FLONASE ) 50 MCG/ACT nasal spray, Place 1 spray into both nostrils daily., Disp: , Rfl:    metoprolol  succinate (TOPROL  XL) 25 MG 24 hr tablet, Take 1 tablet (25 mg total) by mouth daily., Disp: 90 tablet, Rfl: 3   nitroGLYCERIN  (NITROSTAT ) 0.4 MG SL tablet, Place 0.4 mg under the tongue every 5 (five) minutes as needed for chest pain., Disp: , Rfl:    Polyethyl Glyc-Propyl Glyc PF (SYSTANE ULTRA PF) 0.4-0.3 % SOLN, Place 1 drop into both eyes daily as needed (Dry eyes)., Disp: , Rfl:  rosuvastatin  (CRESTOR ) 40 MG tablet, Take 1 tablet (40 mg total) by mouth daily., Disp: 30 tablet, Rfl: 2 [2]  Social History Tobacco Use  Smoking Status Former   Current packs/day: 0.00   Average packs/day: 0.3 packs/day for 20.0 years (5.0 ttl pk-yrs)   Types: Cigarettes   Start date: 67   Quit date: 65   Years since quitting: 35.0  Smokeless Tobacco Never  Tobacco Comments   off and on

## 2025-01-23 ENCOUNTER — Encounter (HOSPITAL_COMMUNITY)

## 2025-01-24 ENCOUNTER — Encounter: Payer: Self-pay | Admitting: Cardiovascular Disease

## 2025-01-24 ENCOUNTER — Encounter (HOSPITAL_COMMUNITY): Payer: Self-pay

## 2025-01-24 DIAGNOSIS — Z951 Presence of aortocoronary bypass graft: Secondary | ICD-10-CM

## 2025-01-24 NOTE — Progress Notes (Signed)
 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  Patient's Home Medications on Admission: Current Medications[1]  Past Medical History: Past Medical History:  Diagnosis Date   Anxiety and depression    Diverticulosis 09/16/2019   GERD (gastroesophageal reflux disease)    occ   History of kidney stones 09/16/2019   Partially obstructing 6 x 3 mm calculus within the mid left ureter causing mild left hydronephrosis   Peyronie disease    Pre-diabetes    Restless leg syndrome    Sciatica    left and right greater on right   Seasonal allergies    Wears glasses     Tobacco Use: Tobacco Use History[2]  Labs: Review Flowsheet       Latest Ref Rng & Units 11/04/2024 01/05/2025  Labs for ITP Cardiac and Pulmonary Rehab  Cholestrol 100 - 199 mg/dL 864  81   LDL (calc) 0 - 99 mg/dL 78  34   HDL-C >60 mg/dL 38  33   Trlycerides 0 - 149 mg/dL 95  61   Hemoglobin J8r 4.8 - 5.6 % 5.3  -  PH, Arterial 7.35 - 7.45 7.319  7.310  7.507  7.458  7.392  7.408  7.341  7.432  -  PCO2 arterial 32 - 48 mmHg 48.2  38.5  22.0  23.2  34.0  35.4  34.7  33.3  -  Bicarbonate 20.0 - 28.0 mmol/L 24.8  19.4  17.7  16.7  20.7  22.4  18.8  22.2  -  TCO2 22 - 32 mmol/L 26  21  18  17  22  25  23  27  29  20  26  23  23   -  Acid-base deficit 0.0 - 2.0 mmol/L 1.0  6.0  5.0  7.0  4.0  2.0  6.0  1.0  -  O2 Saturation % 98  99  100  99  100  100  100  100  -    Details       Multiple values from one day are sorted in  reverse-chronological order         Capillary Blood Glucose: Lab Results  Component Value Date   GLUCAP 95 11/08/2024   GLUCAP 107 (H) 11/07/2024   GLUCAP 132 (H) 11/07/2024   GLUCAP 101 (H) 11/07/2024   GLUCAP 106 (H) 11/07/2024     Exercise Target Goals: Exercise Program Goal: Individual exercise prescription set using results from initial 6 min walk test and THRR while considering  patients activity barriers and safety.   Exercise Prescription Goal: Initial exercise prescription builds to 30-45 minutes a day of aerobic activity, 2-3 days per week.  Home exercise guidelines will be given to patient during program as part of exercise prescription that the participant will acknowledge.  Activity Barriers & Risk Stratification:  Activity Barriers & Cardiac Risk Stratification - 01/17/25 1514       Activity Barriers & Cardiac Risk Stratification   Activity Barriers Joint Problems;Arthritis;Balance Concerns;Back Problems;Neck/Spine Problems;Other (comment)    Comments  Sternal Precautions    Cardiac Risk Stratification High          6 Minute Walk:  6 Minute Walk     Row Name 01/17/25 1513         6 Minute Walk   Phase Initial     Distance 1537 feet     Walk Time 6 minutes     # of Rest Breaks 0     MPH 2.91     METS 3.54     RPE 11     Perceived Dyspnea  0     VO2 Peak 12.41     Symptoms No     Resting HR 68 bpm     Resting BP 102/70     Resting Oxygen Saturation  96 %     Exercise Oxygen Saturation  during 6 min walk 99 %     Max Ex. HR 83 bpm     Max Ex. BP 132/70     2 Minute Post BP 112/70        Oxygen Initial Assessment:   Oxygen Re-Evaluation:   Oxygen Discharge (Final Oxygen Re-Evaluation):   Initial Exercise Prescription:  Initial Exercise Prescription - 01/17/25 1300       Date of Initial Exercise RX and Referring Provider   Date 01/17/25    Referring Provider Jerel Balding, MD    Expected Discharge Date 04/12/25       Treadmill   MPH 2.8    Grade 0    Minutes 15    METs 3.5      Arm Ergometer   Level 1    Watts 25    RPM 60    Minutes 15    METs 3.5      Prescription Details   Frequency (times per week) 3    Duration Progress to 30 minutes of continuous aerobic without signs/symptoms of physical distress      Intensity   THRR 40-80% of Max Heartrate 59-118    Ratings of Perceived Exertion 11-13    Perceived Dyspnea 0-4      Progression   Progression Continue progressive overload as per policy without signs/symptoms or physical distress.      Resistance Training   Training Prescription Yes    Weight 3 lbs    Reps 10-15          Perform Capillary Blood Glucose checks as needed.  Exercise Prescription Changes:   Exercise Comments:   Exercise Goals and Review:   Exercise Goals     Row Name 01/17/25 1336             Exercise Goals   Increase Physical Activity Yes       Intervention Provide advice, education, support and counseling about physical activity/exercise needs.;Develop an individualized exercise prescription for aerobic and resistive training based on initial evaluation findings, risk stratification, comorbidities and participant's personal goals.       Expected Outcomes Short Term: Attend rehab on a regular basis to increase amount of physical activity.;Long Term: Exercising regularly at least 3-5 days a week.;Long Term: Add in home exercise to make exercise part of routine and to increase amount of physical activity.       Increase Strength and Stamina Yes       Intervention Provide advice, education, support and counseling about physical activity/exercise needs.;Develop an individualized exercise prescription for aerobic and resistive training based on initial evaluation findings, risk stratification, comorbidities and participant's personal goals.  Expected Outcomes Short Term: Increase workloads from initial exercise prescription for resistance, speed, and  METs.;Short Term: Perform resistance training exercises routinely during rehab and add in resistance training at home;Long Term: Improve cardiorespiratory fitness, muscular endurance and strength as measured by increased METs and functional capacity ( )       Able to understand and use rate of perceived exertion (RPE) scale Yes       Intervention Provide education and explanation on how to use RPE scale       Expected Outcomes Short Term: Able to use RPE daily in rehab to express subjective intensity level;Long Term:  Able to use RPE to guide intensity level when exercising independently       Knowledge and understanding of Target Heart Rate Range (THRR) Yes       Intervention Provide education and explanation of THRR including how the numbers were predicted and where they are located for reference       Expected Outcomes Short Term: Able to state/look up THRR;Long Term: Able to use THRR to govern intensity when exercising independently;Short Term: Able to use daily as guideline for intensity in rehab       Understanding of Exercise Prescription Yes       Intervention Provide education, explanation, and written materials on patient's individual exercise prescription       Expected Outcomes Short Term: Able to explain program exercise prescription;Long Term: Able to explain home exercise prescription to exercise independently          Exercise Goals Re-Evaluation :   Discharge Exercise Prescription (Final Exercise Prescription Changes):   Nutrition:  Target Goals: Understanding of nutrition guidelines, daily intake of sodium 1500mg , cholesterol 200mg , calories 30% from fat and 7% or less from saturated fats, daily to have 5 or more servings of fruits and vegetables.  Biometrics:  Pre Biometrics - 01/17/25 1325       Pre Biometrics   Waist Circumference 34.25 inches    Hip Circumference 38 inches    Waist to Hip Ratio 0.9 %    Triceps Skinfold 9 mm    % Body Fat 19.9 %    Grip  Strength 24 kg    Flexibility 0 in    Single Leg Stand 8.06 seconds           Nutrition Therapy Plan and Nutrition Goals:   Nutrition Assessments:  MEDIFICTS Score Key: >=70 Need to make dietary changes  40-70 Heart Healthy Diet <= 40 Therapeutic Level Cholesterol Diet    Picture Your Plate Scores: <59 Unhealthy dietary pattern with much room for improvement. 41-50 Dietary pattern unlikely to meet recommendations for good health and room for improvement. 51-60 More healthful dietary pattern, with some room for improvement.  >60 Healthy dietary pattern, although there may be some specific behaviors that could be improved.    Nutrition Goals Re-Evaluation:   Nutrition Goals Re-Evaluation:   Nutrition Goals Discharge (Final Nutrition Goals Re-Evaluation):   Psychosocial: Target Goals: Acknowledge presence or absence of significant depression and/or stress, maximize coping skills, provide positive support system. Participant is able to verbalize types and ability to use techniques and skills needed for reducing stress and depression.  Initial Review & Psychosocial Screening:  Initial Psych Review & Screening - 01/17/25 1347       Initial Review   Current issues with Current Sleep Concerns;Current Stress Concerns    Source of Stress Concerns Family;Financial      Family Dynamics   Good Support System? Yes  Pt has spouse for support     Barriers   Psychosocial barriers to participate in program The patient should benefit from training in stress management and relaxation.      Screening Interventions   Interventions Encouraged to exercise    Expected Outcomes Short Term goal: Utilizing psychosocial counselor, staff and physician to assist with identification of specific Stressors or current issues interfering with healing process. Setting desired goal for each stressor or current issue identified.;Long Term Goal: Stressors or current issues are controlled or  eliminated.;Short Term goal: Identification and review with participant of any Quality of Life or Depression concerns found by scoring the questionnaire.;Long Term goal: The participant improves quality of Life and PHQ9 Scores as seen by post scores and/or verbalization of changes          Quality of Life Scores:  Quality of Life - 01/17/25 1520       Quality of Life   Select Quality of Life      Quality of Life Scores   Health/Function Pre 26.33 %    Socioeconomic Pre 28.71 %    Psych/Spiritual Pre 25 %    Family Pre 30 %    GLOBAL Pre 27.15 %         Scores of 19 and below usually indicate a poorer quality of life in these areas.  A difference of  2-3 points is a clinically meaningful difference.  A difference of 2-3 points in the total score of the Quality of Life Index has been associated with significant improvement in overall quality of life, self-image, physical symptoms, and general health in studies assessing change in quality of life.  PHQ-9: Review Flowsheet       01/17/2025  Depression screen PHQ 2/9  Decreased Interest 0  Down, Depressed, Hopeless 0  PHQ - 2 Score 0  Altered sleeping 1  Tired, decreased energy 1  Change in appetite 0  Feeling bad or failure about yourself  0  Trouble concentrating 0  Moving slowly or fidgety/restless 0  Suicidal thoughts 0  PHQ-9 Score 2  Difficult doing work/chores Not difficult at all   Interpretation of Total Score  Total Score Depression Severity:  1-4 = Minimal depression, 5-9 = Mild depression, 10-14 = Moderate depression, 15-19 = Moderately severe depression, 20-27 = Severe depression   Psychosocial Evaluation and Intervention:   Psychosocial Re-Evaluation:  Psychosocial Re-Evaluation     Row Name 01/24/25 1516             Psychosocial Re-Evaluation   Current issues with Current Stress Concerns;Current Sleep Concerns       Comments Jon Terry has not voiced any additional psychosocial concerns or stressors  since orientation as he has yet to complete his first exercise class d/t inclement weather.       Expected Outcomes Jon Terry will voice controlled or decreased stressors/concerns by completion of cardiac rehab.       Interventions Relaxation education;Encouraged to attend Cardiac Rehabilitation for the exercise;Stress management education       Continue Psychosocial Services  No Follow up required          Psychosocial Discharge (Final Psychosocial Re-Evaluation):  Psychosocial Re-Evaluation - 01/24/25 1516       Psychosocial Re-Evaluation   Current issues with Current Stress Concerns;Current Sleep Concerns    Comments Jon Terry has not voiced any additional psychosocial concerns or stressors since orientation as he has yet to complete his first exercise class d/t inclement weather.    Expected Outcomes  Jon Terry will voice controlled or decreased stressors/concerns by completion of cardiac rehab.    Interventions Relaxation education;Encouraged to attend Cardiac Rehabilitation for the exercise;Stress management education    Continue Psychosocial Services  No Follow up required          Vocational Rehabilitation: Provide vocational rehab assistance to qualifying candidates.   Vocational Rehab Evaluation & Intervention:  Vocational Rehab - 01/17/25 1522       Initial Vocational Rehab Evaluation & Intervention   Assessment shows need for Vocational Rehabilitation No   Pt is retired         Education: Education Goals: Education classes will be provided on a weekly basis, covering required topics. Participant will state understanding/return demonstration of topics presented.     Core Videos: Exercise    Move It!  Clinical staff conducted group or individual video education with verbal and written material and guidebook.  Patient learns the recommended Pritikin exercise program. Exercise with the goal of living a long, healthy life. Some of the health benefits of exercise include controlled  diabetes, healthier blood pressure levels, improved cholesterol levels, improved heart and lung capacity, improved sleep, and better body composition. Everyone should speak with their doctor before starting or changing an exercise routine.  Biomechanical Limitations Clinical staff conducted group or individual video education with verbal and written material and guidebook.  Patient learns how biomechanical limitations can impact exercise and how we can mitigate and possibly overcome limitations to have an impactful and balanced exercise routine.  Body Composition Clinical staff conducted group or individual video education with verbal and written material and guidebook.  Patient learns that body composition (ratio of muscle mass to fat mass) is a key component to assessing overall fitness, rather than body weight alone. Increased fat mass, especially visceral belly fat, can put us  at increased risk for metabolic syndrome, type 2 diabetes, heart disease, and even death. It is recommended to combine diet and exercise (cardiovascular and resistance training) to improve your body composition. Seek guidance from your physician and exercise physiologist before implementing an exercise routine.  Exercise Action Plan Clinical staff conducted group or individual video education with verbal and written material and guidebook.  Patient learns the recommended strategies to achieve and enjoy long-term exercise adherence, including variety, self-motivation, self-efficacy, and positive decision making. Benefits of exercise include fitness, good health, weight management, more energy, better sleep, less stress, and overall well-being.  Medical   Heart Disease Risk Reduction Clinical staff conducted group or individual video education with verbal and written material and guidebook.  Patient learns our heart is our most vital organ as it circulates oxygen, nutrients, white blood cells, and hormones throughout the  entire body, and carries waste away. Data supports a plant-based eating plan like the Pritikin Program for its effectiveness in slowing progression of and reversing heart disease. The video provides a number of recommendations to address heart disease.   Metabolic Syndrome and Belly Fat  Clinical staff conducted group or individual video education with verbal and written material and guidebook.  Patient learns what metabolic syndrome is, how it leads to heart disease, and how one can reverse it and keep it from coming back. You have metabolic syndrome if you have 3 of the following 5 criteria: abdominal obesity, high blood pressure, high triglycerides, low HDL cholesterol, and high blood sugar.  Hypertension and Heart Disease Clinical staff conducted group or individual video education with verbal and written material and guidebook.  Patient learns that high blood pressure,  or hypertension, is very common in the [place] . Hypertension is largely due to excessive salt intake, but other important risk factors include being overweight, physical inactivity, drinking too much alcohol , smoking, and not eating enough potassium from fruits and vegetables. High blood pressure is a leading risk factor for heart attack, stroke, congestive heart failure, dementia, kidney failure, and premature death. Long-term effects of excessive salt intake include stiffening of the arteries and thickening of heart muscle and organ damage. Recommendations include ways to reduce hypertension and the risk of heart disease.  Diseases of Our Time - Focusing on Diabetes Clinical staff conducted group or individual video education with verbal and written material and guidebook.  Patient learns why the best way to stop diseases of our time is prevention, through food and other lifestyle changes. Medicine (such as prescription pills and surgeries) is often only a Band-Aid on the problem, not a long-term solution. Most common diseases  of our time include obesity, type 2 diabetes, hypertension, heart disease, and cancer. The Pritikin Program is recommended and has been proven to help reduce, reverse, and/or prevent the damaging effects of metabolic syndrome.  Nutrition   Overview of the Pritikin Eating Plan  Clinical staff conducted group or individual video education with verbal and written material and guidebook.  Patient learns about the Pritikin Eating Plan for disease risk reduction. The Pritikin Eating Plan emphasizes a wide variety of unrefined, minimally-processed carbohydrates, like fruits, vegetables, whole grains, and legumes. Go, Caution, and Stop food choices are explained. Plant-based and lean animal proteins are emphasized. Rationale provided for low sodium intake for blood pressure control, low added sugars for blood sugar stabilization, and low added fats and oils for coronary artery disease risk reduction and weight management.  Calorie Density  Clinical staff conducted group or individual video education with verbal and written material and guidebook.  Patient learns about calorie density and how it impacts the Pritikin Eating Plan. Knowing the characteristics of the food you choose will help you decide whether those foods will lead to weight gain or weight loss, and whether you want to consume more or less of them. Weight loss is usually a side effect of the Pritikin Eating Plan because of its focus on low calorie-dense foods.  Label Reading  Clinical staff conducted group or individual video education with verbal and written material and guidebook.  Patient learns about the Pritikin recommended label reading guidelines and corresponding recommendations regarding calorie density, added sugars, sodium content, and whole grains.  Dining Out - Part 1  Clinical staff conducted group or individual video education with verbal and written material and guidebook.  Patient learns that restaurant meals can be sabotaging  because they can be so high in calories, fat, sodium, and/or sugar. Patient learns recommended strategies on how to positively address this and avoid unhealthy pitfalls.  Facts on Fats  Clinical staff conducted group or individual video education with verbal and written material and guidebook.  Patient learns that lifestyle modifications can be just as effective, if not more so, as many medications for lowering your risk of heart disease. A Pritikin lifestyle can help to reduce your risk of inflammation and atherosclerosis (cholesterol build-up, or plaque, in the artery walls). Lifestyle interventions such as dietary choices and physical activity address the cause of atherosclerosis. A review of the types of fats and their impact on blood cholesterol levels, along with dietary recommendations to reduce fat intake is also included.  Nutrition Action Plan  Clinical staff conducted group  or individual video education with verbal and written material and guidebook.  Patient learns how to incorporate Pritikin recommendations into their lifestyle. Recommendations include planning and keeping personal health goals in mind as an important part of their success.  Healthy Mind-Set    Healthy Minds, Bodies, Hearts  Clinical staff conducted group or individual video education with verbal and written material and guidebook.  Patient learns how to identify when they are stressed. Video will discuss the impact of that stress, as well as the many benefits of stress management. Patient will also be introduced to stress management techniques. The way we think, act, and feel has an impact on our hearts.  How Our Thoughts Can Heal Our Hearts  Clinical staff conducted group or individual video education with verbal and written material and guidebook.  Patient learns that negative thoughts can cause depression and anxiety. This can result in negative lifestyle behavior and serious health problems. Cognitive behavioral  therapy is an effective method to help control our thoughts in order to change and improve our emotional outlook.  Additional Videos:  Exercise    Improving Performance  Clinical staff conducted group or individual video education with verbal and written material and guidebook.  Patient learns to use a non-linear approach by alternating intensity levels and lengths of time spent exercising to help burn more calories and lose more body fat. Cardiovascular exercise helps improve heart health, metabolism, hormonal balance, blood sugar control, and recovery from fatigue. Resistance training improves strength, endurance, balance, coordination, reaction time, metabolism, and muscle mass. Flexibility exercise improves circulation, posture, and balance. Seek guidance from your physician and exercise physiologist before implementing an exercise routine and learn your capabilities and proper form for all exercise.  Introduction to Yoga  Clinical staff conducted group or individual video education with verbal and written material and guidebook.  Patient learns about yoga, a discipline of the coming together of mind, breath, and body. The benefits of yoga include improved flexibility, improved range of motion, better posture and core strength, increased lung function, weight loss, and positive self-image. Yogas heart health benefits include lowered blood pressure, healthier heart rate, decreased cholesterol and triglyceride levels, improved immune function, and reduced stress. Seek guidance from your physician and exercise physiologist before implementing an exercise routine and learn your capabilities and proper form for all exercise.  Medical   Aging: Enhancing Your Quality of Life  Clinical staff conducted group or individual video education with verbal and written material and guidebook.  Patient learns key strategies and recommendations to stay in good physical health and enhance quality of life, such as  prevention strategies, having an advocate, securing a Health Care Proxy and Power of Attorney, and keeping a list of medications and system for tracking them. It also discusses how to avoid risk for bone loss.  Biology of Weight Control  Clinical staff conducted group or individual video education with verbal and written material and guidebook.  Patient learns that weight gain occurs because we consume more calories than we burn (eating more, moving less). Even if your body weight is normal, you may have higher ratios of fat compared to muscle mass. Too much body fat puts you at increased risk for cardiovascular disease, heart attack, stroke, type 2 diabetes, and obesity-related cancers. In addition to exercise, following the Pritikin Eating Plan can help reduce your risk.  Decoding Lab Results  Clinical staff conducted group or individual video education with verbal and written material and guidebook.  Patient learns that lab  test reflects one measurement whose values change over time and are influenced by many factors, including medication, stress, sleep, exercise, food, hydration, pre-existing medical conditions, and more. It is recommended to use the knowledge from this video to become more involved with your lab results and evaluate your numbers to speak with your doctor.   Diseases of Our Time - Overview  Clinical staff conducted group or individual video education with verbal and written material and guidebook.  Patient learns that according to the CDC, 50% to 70% of chronic diseases (such as obesity, type 2 diabetes, elevated lipids, hypertension, and heart disease) are avoidable through lifestyle improvements including healthier food choices, listening to satiety cues, and increased physical activity.  Sleep Disorders Clinical staff conducted group or individual video education with verbal and written material and guidebook.  Patient learns how good quality and duration of sleep are  important to overall health and well-being. Patient also learns about sleep disorders and how they impact health along with recommendations to address them, including discussing with a physician.  Nutrition  Dining Out - Part 2 Clinical staff conducted group or individual video education with verbal and written material and guidebook.  Patient learns how to plan ahead and communicate in order to maximize their dining experience in a healthy and nutritious manner. Included are recommended food choices based on the type of restaurant the patient is visiting.   Fueling a Banker conducted group or individual video education with verbal and written material and guidebook.  There is a strong connection between our food choices and our health. Diseases like obesity and type 2 diabetes are very prevalent and are in large-part due to lifestyle choices. The Pritikin Eating Plan provides plenty of food and hunger-curbing satisfaction. It is easy to follow, affordable, and helps reduce health risks.  Menu Workshop  Clinical staff conducted group or individual video education with verbal and written material and guidebook.  Patient learns that restaurant meals can sabotage health goals because they are often packed with calories, fat, sodium, and sugar. Recommendations include strategies to plan ahead and to communicate with the manager, chef, or server to help order a healthier meal.  Planning Your Eating Strategy  Clinical staff conducted group or individual video education with verbal and written material and guidebook.  Patient learns about the Pritikin Eating Plan and its benefit of reducing the risk of disease. The Pritikin Eating Plan does not focus on calories. Instead, it emphasizes high-quality, nutrient-rich foods. By knowing the characteristics of the foods, we choose, we can determine their calorie density and make informed decisions.  Targeting Your Nutrition Priorities   Clinical staff conducted group or individual video education with verbal and written material and guidebook.  Patient learns that lifestyle habits have a tremendous impact on disease risk and progression. This video provides eating and physical activity recommendations based on your personal health goals, such as reducing LDL cholesterol, losing weight, preventing or controlling type 2 diabetes, and reducing high blood pressure.  Vitamins and Minerals  Clinical staff conducted group or individual video education with verbal and written material and guidebook.  Patient learns different ways to obtain key vitamins and minerals, including through a recommended healthy diet. It is important to discuss all supplements you take with your doctor.   Healthy Mind-Set    Smoking Cessation  Clinical staff conducted group or individual video education with verbal and written material and guidebook.  Patient learns that cigarette smoking and tobacco addiction pose  a serious health risk which affects millions of people. Stopping smoking will significantly reduce the risk of heart disease, lung disease, and many forms of cancer. Recommended strategies for quitting are covered, including working with your doctor to develop a successful plan.  Culinary   Becoming a Set Designer conducted group or individual video education with verbal and written material and guidebook.  Patient learns that cooking at home can be healthy, cost-effective, quick, and puts them in control. Keys to cooking healthy recipes will include looking at your recipe, assessing your equipment needs, planning ahead, making it simple, choosing cost-effective seasonal ingredients, and limiting the use of added fats, salts, and sugars.  Cooking - Breakfast and Snacks  Clinical staff conducted group or individual video education with verbal and written material and guidebook.  Patient learns how important breakfast is to satiety  and nutrition through the entire day. Recommendations include key foods to eat during breakfast to help stabilize blood sugar levels and to prevent overeating at meals later in the day. Planning ahead is also a key component.  Cooking - Educational Psychologist conducted group or individual video education with verbal and written material and guidebook.  Patient learns eating strategies to improve overall health, including an approach to cook more at home. Recommendations include thinking of animal protein as a side on your plate rather than center stage and focusing instead on lower calorie dense options like vegetables, fruits, whole grains, and plant-based proteins, such as beans. Making sauces in large quantities to freeze for later and leaving the skin on your vegetables are also recommended to maximize your experience.  Cooking - Healthy Salads and Dressing Clinical staff conducted group or individual video education with verbal and written material and guidebook.  Patient learns that vegetables, fruits, whole grains, and legumes are the foundations of the Pritikin Eating Plan. Recommendations include how to incorporate each of these in flavorful and healthy salads, and how to create homemade salad dressings. Proper handling of ingredients is also covered. Cooking - Soups and [employer] - Soups and Desserts Clinical staff conducted group or individual video education with verbal and written material and guidebook.  Patient learns that Pritikin soups and desserts make for easy, nutritious, and delicious snacks and meal components that are low in sodium, fat, sugar, and calorie density, while high in vitamins, minerals, and filling fiber. Recommendations include simple and healthy ideas for soups and desserts.   Overview     The Pritikin Solution Program Overview Clinical staff conducted group or individual video education with verbal and written material and guidebook.  Patient  learns that the results of the Pritikin Program have been documented in more than 100 articles published in peer-reviewed journals, and the benefits include reducing risk factors for (and, in some cases, even reversing) high cholesterol, high blood pressure, type 2 diabetes, obesity, and more! An overview of the three key pillars of the Pritikin Program will be covered: eating well, doing regular exercise, and having a healthy mind-set.  WORKSHOPS  Exercise: Exercise Basics: Building Your Action Plan Clinical staff led group instruction and group discussion with PowerPoint presentation and patient guidebook. To enhance the learning environment the use of posters, models and videos may be added. At the conclusion of this workshop, patients will comprehend the difference between physical activity and exercise, as well as the benefits of incorporating both, into their routine. Patients will understand the FITT (Frequency, Intensity, Time, and Type) principle and how  to use it to build an exercise action plan. In addition, safety concerns and other considerations for exercise and cardiac rehab will be addressed by the presenter. The purpose of this lesson is to promote a comprehensive and effective weekly exercise routine in order to improve patients overall level of fitness.   Managing Heart Disease: Your Path to a Healthier Heart Clinical staff led group instruction and group discussion with PowerPoint presentation and patient guidebook. To enhance the learning environment the use of posters, models and videos may be added.At the conclusion of this workshop, patients will understand the anatomy and physiology of the heart. Additionally, they will understand how Pritikins three pillars impact the risk factors, the progression, and the management of heart disease.  The purpose of this lesson is to provide a high-level overview of the heart, heart disease, and how the Pritikin lifestyle positively  impacts risk factors.  Exercise Biomechanics Clinical staff led group instruction and group discussion with PowerPoint presentation and patient guidebook. To enhance the learning environment the use of posters, models and videos may be added. Patients will learn how the structural parts of their bodies function and how these functions impact their daily activities, movement, and exercise. Patients will learn how to promote a neutral spine, learn how to manage pain, and identify ways to improve their physical movement in order to promote healthy living. The purpose of this lesson is to expose patients to common physical limitations that impact physical activity. Participants will learn practical ways to adapt and manage aches and pains, and to minimize their effect on regular exercise. Patients will learn how to maintain good posture while sitting, walking, and lifting.  Balance Training and Fall Prevention  Clinical staff led group instruction and group discussion with PowerPoint presentation and patient guidebook. To enhance the learning environment the use of posters, models and videos may be added. At the conclusion of this workshop, patients will understand the importance of their sensorimotor skills (vision, proprioception, and the vestibular system) in maintaining their ability to balance as they age. Patients will apply a variety of balancing exercises that are appropriate for their current level of function. Patients will understand the common causes for poor balance, possible solutions to these problems, and ways to modify their physical environment in order to minimize their fall risk. The purpose of this lesson is to teach patients about the importance of maintaining balance as they age and ways to minimize their risk of falling.  WORKSHOPS   Nutrition:  Fueling a Ship Broker led group instruction and group discussion with PowerPoint presentation and patient  guidebook. To enhance the learning environment the use of posters, models and videos may be added. Patients will review the foundational principles of the Pritikin Eating Plan and understand what constitutes a serving size in each of the food groups. Patients will also learn Pritikin-friendly foods that are better choices when away from home and review make-ahead meal and snack options. Calorie density will be reviewed and applied to three nutrition priorities: weight maintenance, weight loss, and weight gain. The purpose of this lesson is to reinforce (in a group setting) the key concepts around what patients are recommended to eat and how to apply these guidelines when away from home by planning and selecting Pritikin-friendly options. Patients will understand how calorie density may be adjusted for different weight management goals.  Mindful Eating  Clinical staff led group instruction and group discussion with PowerPoint presentation and patient guidebook. To enhance the learning environment  the use of posters, models and videos may be added. Patients will briefly review the concepts of the Pritikin Eating Plan and the importance of low-calorie dense foods. The concept of mindful eating will be introduced as well as the importance of paying attention to internal hunger signals. Triggers for non-hunger eating and techniques for dealing with triggers will be explored. The purpose of this lesson is to provide patients with the opportunity to review the basic principles of the Pritikin Eating Plan, discuss the value of eating mindfully and how to measure internal cues of hunger and fullness using the Hunger Scale. Patients will also discuss reasons for non-hunger eating and learn strategies to use for controlling emotional eating.  Targeting Your Nutrition Priorities Clinical staff led group instruction and group discussion with PowerPoint presentation and patient guidebook. To enhance the learning  environment the use of posters, models and videos may be added. Patients will learn how to determine their genetic susceptibility to disease by reviewing their family history. Patients will gain insight into the importance of diet as part of an overall healthy lifestyle in mitigating the impact of genetics and other environmental insults. The purpose of this lesson is to provide patients with the opportunity to assess their personal nutrition priorities by looking at their family history, their own health history and current risk factors. Patients will also be able to discuss ways of prioritizing and modifying the Pritikin Eating Plan for their highest risk areas  Menu  Clinical staff led group instruction and group discussion with PowerPoint presentation and patient guidebook. To enhance the learning environment the use of posters, models and videos may be added. Using menus brought in from [employer], or printed from [employer], patients will apply the Pritikin dining out guidelines that were presented in the [employer] video. Patients will also be able to practice these guidelines in a variety of provided scenarios. The purpose of this lesson is to provide patients with the opportunity to practice hands-on learning of the Pritikin Dining Out guidelines with actual menus and practice scenarios.  Label Reading Clinical staff led group instruction and group discussion with PowerPoint presentation and patient guidebook. To enhance the learning environment the use of posters, models and videos may be added. Patients will review and discuss the Pritikin label reading guidelines presented in Pritikins Label Reading Educational series video. Using fool labels brought in from local grocery stores and markets, patients will apply the label reading guidelines and determine if the packaged food meet the Pritikin guidelines. The purpose of this lesson is to provide patients with the  opportunity to review, discuss, and practice hands-on learning of the Pritikin Label Reading guidelines with actual packaged food labels. Cooking School  Pritikins [employer] are designed to teach patients ways to prepare quick, simple, and affordable recipes at home. The importance of nutritions role in chronic disease risk reduction is reflected in its emphasis in the overall Pritikin program. By learning how to prepare essential core Pritikin Eating Plan recipes, patients will increase control over what they eat; be able to customize the flavor of foods without the use of added salt, sugar, or fat; and improve the quality of the food they consume. By learning a set of core recipes which are easily assembled, quickly prepared, and affordable, patients are more likely to prepare more healthy foods at home. These workshops focus on convenient breakfasts, simple entres, side dishes, and desserts which can be prepared with minimal effort and are consistent with  nutrition recommendations for cardiovascular risk reduction. Cooking [employer] are taught by a armed forces logistics/support/administrative officer (RD) who has been trained by the [employer]. The chef or RD has a clear understanding of the importance of minimizing - if not completely eliminating - added fat, sugar, and sodium in recipes. Throughout the series of Cooking School Workshop sessions, patients will learn about healthy ingredients and efficient methods of cooking to build confidence in their capability to prepare    Cooking School weekly topics:  Adding Flavor- Sodium-Free  Fast and Healthy Breakfasts  Powerhouse Plant-Based Proteins  Satisfying Salads and Dressings  Simple Sides and Sauces  International Cuisine-Spotlight on the [employer] Zones  Delicious Desserts  Savory Soups  [employer] - Meals in a Astronomer Appetizers and Snacks  Comforting Weekend Breakfasts  One-Pot Wonders   Fast Evening Meals  Biomedical Scientist Your Pritikin Plate  WORKSHOPS   Healthy Mindset (Psychosocial):  Focused Goals, Sustainable Changes Clinical staff led group instruction and group discussion with PowerPoint presentation and patient guidebook. To enhance the learning environment the use of posters, models and videos may be added. Patients will be able to apply effective goal setting strategies to establish at least one personal goal, and then take consistent, meaningful action toward that goal. They will learn to identify common barriers to achieving personal goals and develop strategies to overcome them. Patients will also gain an understanding of how our mind-set can impact our ability to achieve goals and the importance of cultivating a positive and growth-oriented mind-set. The purpose of this lesson is to provide patients with a deeper understanding of how to set and achieve personal goals, as well as the tools and strategies needed to overcome common obstacles which may arise along the way.  From Head to Heart: The Power of a Healthy Outlook  Clinical staff led group instruction and group discussion with PowerPoint presentation and patient guidebook. To enhance the learning environment the use of posters, models and videos may be added. Patients will be able to recognize and describe the impact of emotions and mood on physical health. They will discover the importance of self-care and explore self-care practices which may work for them. Patients will also learn how to utilize the 4 Cs to cultivate a healthier outlook and better manage stress and challenges. The purpose of this lesson is to demonstrate to patients how a healthy outlook is an essential part of maintaining good health, especially as they continue their cardiac rehab journey.  Healthy Sleep for a Healthy Heart Clinical staff led group instruction and group discussion with PowerPoint presentation and patient guidebook. To enhance the  learning environment the use of posters, models and videos may be added. At the conclusion of this workshop, patients will be able to demonstrate knowledge of the importance of sleep to overall health, well-being, and quality of life. They will understand the symptoms of, and treatments for, common sleep disorders. Patients will also be able to identify daytime and nighttime behaviors which impact sleep, and they will be able to apply these tools to help manage sleep-related challenges. The purpose of this lesson is to provide patients with a general overview of sleep and outline the importance of quality sleep. Patients will learn about a few of the most common sleep disorders. Patients will also be introduced to the concept of sleep hygiene, and discover ways to self-manage certain sleeping problems through simple daily behavior changes. Finally, the workshop will motivate patients  by clarifying the links between quality sleep and their goals of heart-healthy living.   Recognizing and Reducing Stress Clinical staff led group instruction and group discussion with PowerPoint presentation and patient guidebook. To enhance the learning environment the use of posters, models and videos may be added. At the conclusion of this workshop, patients will be able to understand the types of stress reactions, differentiate between acute and chronic stress, and recognize the impact that chronic stress has on their health. They will also be able to apply different coping mechanisms, such as reframing negative self-talk. Patients will have the opportunity to practice a variety of stress management techniques, such as deep abdominal breathing, progressive muscle relaxation, and/or guided imagery.  The purpose of this lesson is to educate patients on the role of stress in their lives and to provide healthy techniques for coping with it.  Learning Barriers/Preferences:  Learning Barriers/Preferences - 01/17/25 1348        Learning Barriers/Preferences   Learning Barriers Sight    Learning Preferences Audio;Computer/Internet;Group Instruction;Individual Instruction;Pictoral;Skilled Demonstration;Verbal Instruction;Video;Written Material          Education Topics:  Knowledge Questionnaire Score:  Knowledge Questionnaire Score - 01/17/25 1521       Knowledge Questionnaire Score   Pre Score 22/24          Core Components/Risk Factors/Patient Goals at Admission:  Personal Goals and Risk Factors at Admission - 01/17/25 1522       Core Components/Risk Factors/Patient Goals on Admission    Weight Management Weight Maintenance    Hypertension Yes    Intervention Provide education on lifestyle modifcations including regular physical activity/exercise, weight management, moderate sodium restriction and increased consumption of fresh fruit, vegetables, and low fat dairy, alcohol  moderation, and smoking cessation.;Monitor prescription use compliance.    Expected Outcomes Short Term: Continued assessment and intervention until BP is < 140/4mm HG in hypertensive participants. < 130/91mm HG in hypertensive participants with diabetes, heart failure or chronic kidney disease.;Long Term: Maintenance of blood pressure at goal levels.    Lipids Yes    Intervention Provide education and support for participant on nutrition & aerobic/resistive exercise along with prescribed medications to achieve LDL 70mg , HDL >40mg .    Expected Outcomes Short Term: Participant states understanding of desired cholesterol values and is compliant with medications prescribed. Participant is following exercise prescription and nutrition guidelines.;Long Term: Cholesterol controlled with medications as prescribed, with individualized exercise RX and with personalized nutrition plan. Value goals: LDL < 70mg , HDL > 40 mg.    Stress Yes    Intervention Offer individual and/or small group education and counseling on adjustment to heart disease,  stress management and health-related lifestyle change. Teach and support self-help strategies.;Refer participants experiencing significant psychosocial distress to appropriate mental health specialists for further evaluation and treatment. When possible, include family members and significant others in education/counseling sessions.    Expected Outcomes Short Term: Participant demonstrates changes in health-related behavior, relaxation and other stress management skills, ability to obtain effective social support, and compliance with psychotropic medications if prescribed.;Long Term: Emotional wellbeing is indicated by absence of clinically significant psychosocial distress or social isolation.          Core Components/Risk Factors/Patient Goals Review:   Goals and Risk Factor Review     Row Name 01/24/25 1518             Core Components/Risk Factors/Patient Goals Review   Personal Goals Review Weight Management/Obesity;Hypertension;Lipids;Stress       Review Jon Terry has yet  to complete his first exercise class due to inclement weather.       Expected Outcomes Jon Terry will continue to particpate in cardiac rehab for exercise nutrition and lifestyle modifications.          Core Components/Risk Factors/Patient Goals at Discharge (Final Review):   Goals and Risk Factor Review - 01/24/25 1518       Core Components/Risk Factors/Patient Goals Review   Personal Goals Review Weight Management/Obesity;Hypertension;Lipids;Stress    Review Jon Terry has yet to complete his first exercise class due to inclement weather.    Expected Outcomes Jon Terry will continue to particpate in cardiac rehab for exercise nutrition and lifestyle modifications.           ITP Comments:  ITP Comments     Row Name 01/17/25 1512           ITP Comments Jon Bihari, MD: Medical Director. Introduction to the [facility] / Intensive Cardiac Rehab. Initial orientation packet reviewed with the patient.           Comments: Pt has not yet completed first day of rehab post-orientation due to inclement weather.    [1]  Current Outpatient Medications:    aspirin  EC 325 MG tablet, Take 1 tablet (325 mg total) by mouth daily., Disp: , Rfl:    citalopram  (CELEXA ) 20 MG tablet, Take 20 mg by mouth daily., Disp: , Rfl:    fluticasone  (FLONASE ) 50 MCG/ACT nasal spray, Place 1 spray into both nostrils daily., Disp: , Rfl:    metoprolol  succinate (TOPROL  XL) 25 MG 24 hr tablet, Take 1 tablet (25 mg total) by mouth daily., Disp: 90 tablet, Rfl: 3   nitroGLYCERIN  (NITROSTAT ) 0.4 MG SL tablet, Place 0.4 mg under the tongue every 5 (five) minutes as needed for chest pain., Disp: , Rfl:    Polyethyl Glyc-Propyl Glyc PF (SYSTANE ULTRA PF) 0.4-0.3 % SOLN, Place 1 drop into both eyes daily as needed (Dry eyes)., Disp: , Rfl:    rosuvastatin  (CRESTOR ) 40 MG tablet, Take 1 tablet (40 mg total) by mouth daily., Disp: 30 tablet, Rfl: 2 [2]  Social History Tobacco Use  Smoking Status Former   Current packs/day: 0.00   Average packs/day: 0.3 packs/day for 20.0 years (5.0 ttl pk-yrs)   Types: Cigarettes   Start date: 19   Quit date: 95   Years since quitting: 35.0  Smokeless Tobacco Never  Tobacco Comments   off and on

## 2025-01-25 ENCOUNTER — Encounter (HOSPITAL_COMMUNITY)
Admission: RE | Admit: 2025-01-25 | Discharge: 2025-01-25 | Disposition: A | Source: Ambulatory Visit | Attending: Cardiovascular Disease

## 2025-01-25 DIAGNOSIS — Z951 Presence of aortocoronary bypass graft: Secondary | ICD-10-CM

## 2025-01-25 NOTE — Progress Notes (Signed)
 Daily Session Note  Patient Details  Name: Jon Terry MRN: [id number] Date of Birth: May 03, 1951 Referring Provider:   Flowsheet Row INTENSIVE CARDIAC REHAB ORIENT from 01/17/2025 in [facility]  Referring Provider Jerel Balding, MD    Encounter Date: 01/25/2025  Check In:  Session Check In - 01/25/25 1246       Check-In   Supervising physician immediately available to respond to emergencies [facility] MD immediately available    Physician(s) [facility]    Location [facility]    Staff Present Alm Parkins, MS, ACSM-CEP, CCRP, Exercise Physiologist;Other;Jetta Vannie BS, ACSM-CEP, Exercise Physiologist;Maria Whitaker, RN, Valere Music, RN, Avonne Gal, MS, ACSM-CEP, Exercise Physiologist    Virtual Visit No    Medication changes reported     No    Fall or balance concerns reported    No    Tobacco Cessation No Change    Warm-up and Cool-down Performed as group-led instruction    Resistance Training Performed No    VAD Patient? No    PAD/SET Patient? No      Pain Assessment   Currently in Pain? No/denies    Pain Score 0-No pain    Multiple Pain Sites No          Capillary Blood Glucose: No results found for this or any previous visit (from the past 24 hours).   Exercise Prescription Changes - 01/25/25 1400       Response to Exercise   Blood Pressure (Admit) 98/70    Blood Pressure (Exercise) 138/66    Blood Pressure (Exit) 98/62    Heart Rate (Admit) 67 bpm    Heart Rate (Exercise) 108 bpm    Heart Rate (Exit) 76 bpm    Rating of Perceived Exertion (Exercise) 13    Symptoms None    Comments Pt's first day in the [facility] program.    Duration Continue with 30 min of aerobic exercise without signs/symptoms of physical distress.    Intensity THRR unchanged      Progression   Progression Continue to progress workloads to maintain intensity without signs/symptoms of physical  distress.    Average METs 2.4      Resistance Training   Weight No wts on Wednesdays      Interval Training   Interval Training No      Treadmill   MPH 2.8    Grade 0    Minutes 15    METs 3.14      Arm Ergometer   Level 1    Watts 9    RPM 38    Minutes 15    METs 1.7          Tobacco Use History[1]  Goals Met:  Exercise tolerated well No report of concerns or symptoms today  Goals Unmet:  Not Applicable  Comments: Pt started cardiac rehab today.  Pt tolerated light exercise without difficulty. VSS, telemetry-NSR, asymptomatic.  Medication list reconciled. Pt denies barriers to medication compliance.  PSYCHOSOCIAL ASSESSMENT:  PHQ-2. Pt exhibits positive coping skills, hopeful outlook with supportive family. No psychosocial needs identified at this time, no psychosocial interventions necessary.   Pt oriented to exercise equipment and routine.    Understanding verbalized.     Dr. Wilbert Bihari is Medical Director for Cardiac Rehab at [facility].     [1]  Social History Tobacco Use  Smoking Status Former   Current packs/day: 0.00  Average packs/day: 0.3 packs/day for 20.0 years (5.0 ttl pk-yrs)   Types: Cigarettes   Start date: 82   Quit date: 20   Years since quitting: 35.0  Smokeless Tobacco Never  Tobacco Comments   off and on

## 2025-01-26 ENCOUNTER — Other Ambulatory Visit: Payer: Self-pay | Admitting: Student

## 2025-01-27 ENCOUNTER — Encounter (HOSPITAL_COMMUNITY)
Admission: RE | Admit: 2025-01-27 | Discharge: 2025-01-27 | Disposition: A | Source: Ambulatory Visit | Attending: Cardiovascular Disease

## 2025-01-27 DIAGNOSIS — Z951 Presence of aortocoronary bypass graft: Secondary | ICD-10-CM

## 2025-01-30 ENCOUNTER — Encounter (HOSPITAL_COMMUNITY)

## 2025-02-01 ENCOUNTER — Encounter (HOSPITAL_COMMUNITY)
Admission: RE | Admit: 2025-02-01 | Discharge: 2025-02-01 | Disposition: A | Source: Ambulatory Visit | Attending: Cardiovascular Disease

## 2025-02-01 DIAGNOSIS — Z951 Presence of aortocoronary bypass graft: Secondary | ICD-10-CM

## 2025-02-03 ENCOUNTER — Encounter (HOSPITAL_COMMUNITY): Admission: RE | Admit: 2025-02-03 | Source: Ambulatory Visit

## 2025-02-03 ENCOUNTER — Other Ambulatory Visit: Payer: Self-pay | Admitting: Physician Assistant

## 2025-02-03 DIAGNOSIS — Z951 Presence of aortocoronary bypass graft: Secondary | ICD-10-CM

## 2025-02-06 ENCOUNTER — Encounter (HOSPITAL_COMMUNITY)

## 2025-02-08 ENCOUNTER — Encounter (HOSPITAL_COMMUNITY)

## 2025-02-10 ENCOUNTER — Encounter (HOSPITAL_COMMUNITY)

## 2025-02-13 ENCOUNTER — Encounter (HOSPITAL_COMMUNITY)

## 2025-02-15 ENCOUNTER — Encounter (HOSPITAL_COMMUNITY)

## 2025-02-17 ENCOUNTER — Encounter (HOSPITAL_COMMUNITY)

## 2025-02-20 ENCOUNTER — Encounter (HOSPITAL_COMMUNITY)

## 2025-02-22 ENCOUNTER — Encounter (HOSPITAL_COMMUNITY)

## 2025-02-24 ENCOUNTER — Encounter (HOSPITAL_COMMUNITY)

## 2025-02-27 ENCOUNTER — Encounter (HOSPITAL_COMMUNITY)

## 2025-03-01 ENCOUNTER — Encounter (HOSPITAL_COMMUNITY)

## 2025-03-03 ENCOUNTER — Encounter (HOSPITAL_COMMUNITY)

## 2025-03-06 ENCOUNTER — Encounter (HOSPITAL_COMMUNITY)

## 2025-03-08 ENCOUNTER — Encounter (HOSPITAL_COMMUNITY)

## 2025-03-10 ENCOUNTER — Encounter (HOSPITAL_COMMUNITY)

## 2025-03-13 ENCOUNTER — Encounter (HOSPITAL_COMMUNITY)

## 2025-03-13 ENCOUNTER — Ambulatory Visit: Admitting: Student

## 2025-03-15 ENCOUNTER — Encounter (HOSPITAL_COMMUNITY)

## 2025-03-17 ENCOUNTER — Encounter (HOSPITAL_COMMUNITY)

## 2025-03-20 ENCOUNTER — Encounter (HOSPITAL_COMMUNITY)

## 2025-03-22 ENCOUNTER — Encounter (HOSPITAL_COMMUNITY)

## 2025-03-24 ENCOUNTER — Encounter (HOSPITAL_COMMUNITY)

## 2025-03-27 ENCOUNTER — Encounter (HOSPITAL_COMMUNITY)

## 2025-03-29 ENCOUNTER — Encounter (HOSPITAL_COMMUNITY)

## 2025-03-31 ENCOUNTER — Encounter (HOSPITAL_COMMUNITY)

## 2025-04-03 ENCOUNTER — Encounter (HOSPITAL_COMMUNITY)

## 2025-04-05 ENCOUNTER — Encounter (HOSPITAL_COMMUNITY)

## 2025-04-07 ENCOUNTER — Encounter (HOSPITAL_COMMUNITY)

## 2025-04-10 ENCOUNTER — Encounter (HOSPITAL_COMMUNITY)

## 2025-04-12 ENCOUNTER — Encounter (HOSPITAL_COMMUNITY)
